# Patient Record
Sex: Male | Born: 1990 | Race: White | Hispanic: No | Marital: Single | Smoking: Never smoker
Health system: Southern US, Community
[De-identification: ages and names within clinical notes are randomized; demographics above are authoritative.]

## PROBLEM LIST (undated history)

## (undated) DIAGNOSIS — G43909 Migraine, unspecified, not intractable, without status migrainosus: Secondary | ICD-10-CM

## (undated) DIAGNOSIS — T7840XA Allergy, unspecified, initial encounter: Secondary | ICD-10-CM

## (undated) DIAGNOSIS — J301 Allergic rhinitis due to pollen: Secondary | ICD-10-CM

## (undated) DIAGNOSIS — F988 Other specified behavioral and emotional disorders with onset usually occurring in childhood and adolescence: Secondary | ICD-10-CM

## (undated) DIAGNOSIS — B2790 Infectious mononucleosis, unspecified without complication: Secondary | ICD-10-CM

## (undated) DIAGNOSIS — R569 Unspecified convulsions: Secondary | ICD-10-CM

## (undated) DIAGNOSIS — R0781 Pleurodynia: Secondary | ICD-10-CM

## (undated) HISTORY — DX: Infectious mononucleosis, unspecified without complication: B27.90

## (undated) HISTORY — DX: Allergic rhinitis due to pollen: J30.1

## (undated) HISTORY — DX: Allergy, unspecified, initial encounter: T78.40XA

## (undated) HISTORY — DX: Unspecified convulsions: R56.9

## (undated) HISTORY — DX: Migraine, unspecified, not intractable, without status migrainosus: G43.909

---
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
## 2015-01-19 ENCOUNTER — Ambulatory Visit: Payer: Self-pay | Admitting: Physician Assistant

## 2015-01-20 ENCOUNTER — Telehealth: Payer: Self-pay | Admitting: *Deleted

## 2015-01-20 NOTE — Telephone Encounter (Signed)
Unable to reach patient at time of Pre-Visit Call.  Left message for patient to return call when available.    

## 2015-01-21 ENCOUNTER — Encounter: Payer: Self-pay | Admitting: Physician Assistant

## 2015-01-21 ENCOUNTER — Ambulatory Visit (INDEPENDENT_AMBULATORY_CARE_PROVIDER_SITE_OTHER): Payer: BLUE CROSS/BLUE SHIELD | Admitting: Physician Assistant

## 2015-01-21 VITALS — BP 132/75 | HR 71 | Temp 98.1°F | Ht 73.25 in | Wt 177.8 lb

## 2015-01-21 DIAGNOSIS — Z0000 Encounter for general adult medical examination without abnormal findings: Secondary | ICD-10-CM | POA: Diagnosis not present

## 2015-01-21 DIAGNOSIS — F909 Attention-deficit hyperactivity disorder, unspecified type: Secondary | ICD-10-CM | POA: Insufficient documentation

## 2015-01-21 DIAGNOSIS — F900 Attention-deficit hyperactivity disorder, predominantly inattentive type: Secondary | ICD-10-CM

## 2015-01-21 LAB — LIPID PANEL
CHOL/HDL RATIO: 4
Cholesterol: 148 mg/dL (ref 0–200)
HDL: 40.7 mg/dL (ref >39.00)
LDL CALC: 87 mg/dL (ref 0–99)
NONHDL: 107.3
Triglycerides: 101 mg/dL (ref 0.0–149.0)
VLDL: 20.2 mg/dL (ref 0.0–40.0)

## 2015-01-21 LAB — COMPREHENSIVE METABOLIC PANEL
ALT: 17 U/L (ref 0–53)
AST: 21 U/L (ref 0–37)
Albumin: 4.8 g/dL (ref 3.5–5.2)
Alkaline Phosphatase: 59 U/L (ref 39–117)
BILIRUBIN TOTAL: 0.9 mg/dL (ref 0.2–1.2)
BUN: 12 mg/dL (ref 6–23)
CALCIUM: 9.7 mg/dL (ref 8.4–10.5)
CHLORIDE: 103 meq/L (ref 96–112)
CO2: 27 meq/L (ref 19–32)
CREATININE: 1.07 mg/dL (ref 0.40–1.50)
GFR: 91.6 mL/min (ref >60.00)
GLUCOSE: 83 mg/dL (ref 70–99)
Potassium: 4 mEq/L (ref 3.5–5.1)
Sodium: 136 mEq/L (ref 135–145)
TOTAL PROTEIN: 7.4 g/dL (ref 6.0–8.3)

## 2015-01-21 LAB — POCT URINALYSIS DIPSTICK
BILIRUBIN UA: NEGATIVE
Glucose, UA: NEGATIVE
KETONES UA: NEGATIVE
Leukocytes, UA: NEGATIVE
Nitrite, UA: NEGATIVE
PH UA: 6.5
PROTEIN UA: NEGATIVE
RBC UA: NEGATIVE
SPEC GRAV UA: 1.02
Urobilinogen, UA: 0.2

## 2015-01-21 LAB — CBC
HEMATOCRIT: 45.8 % (ref 39.0–52.0)
HEMOGLOBIN: 15.9 g/dL (ref 13.0–17.0)
MCHC: 34.8 g/dL (ref 30.0–36.0)
MCV: 91.6 fl (ref 78.0–100.0)
PLATELETS: 224 10*3/uL (ref 150.0–400.0)
RBC: 4.99 Mil/uL (ref 4.22–5.81)
RDW: 13.1 % (ref 11.5–15.5)
WBC: 5 10*3/uL (ref 4.0–10.5)

## 2015-01-21 LAB — TSH: TSH: 2.15 u[IU]/mL (ref 0.35–4.50)

## 2015-01-21 NOTE — Assessment & Plan Note (Signed)
Depression screen negative.  Tetanus up-to-date.  Preventive care discussed with patient.  Handout on preventive care given to patient in AVS.  Will obtain fasting labs today to include CBC, CMP, TSH, Lipid Panel, UA.

## 2015-01-21 NOTE — Assessment & Plan Note (Signed)
Well-controlled.  Followed by Psychiatry.

## 2015-01-21 NOTE — Progress Notes (Signed)
Patient presents to clinic today to establish care.  Is requesting CPE today.  Is fasting for labs.  Acute Concerns: No acute concerns today.  Chronic Issues: ADD -- Followed by Psychiatry.  Tolerating Vyvanse well without side effect.  Health Maintenance: Dental -- up-to-date Vision -- up-to-date Immunizations -- Tetanus up-to-date  Past Medical History  Diagnosis Date  . Hay fever   . Allergy   . Migraines   . Seizures     once after concussion  . Mononucleosis     Past Surgical History  Procedure Laterality Date  . Tonsillectomy and adenoidectomy  2003    No current outpatient prescriptions on file prior to visit.   No current facility-administered medications on file prior to visit.    No Known Allergies  Family History  Problem Relation Age of Onset  . Arthritis Mother   . Hyperlipidemia Mother   . Mental illness Mother   . Diabetes Father   . Mental illness Maternal Grandmother   . Hyperlipidemia Maternal Grandfather   . Heart disease Maternal Grandfather   . Hypertension Maternal Grandfather   . Diabetes Maternal Grandfather     History   Social History  . Marital Status: Single    Spouse Name: N/A  . Number of Children: N/A  . 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 Smoking status: Never Smoker   . Smokeless tobacco: Never Used  . Alcohol Use: 0.0 oz/week    0 Standard drinks or equivalent per week     Comment: soical  . Drug Use: No  . Sexual Activity:    Partners: Female   Other Topics Concern  . Not on file   Social History Narrative  . No narrative on file   Review of Systems  Constitutional: Negative for fever and weight loss.  HENT: Negative for ear discharge, ear pain, hearing loss and tinnitus.   Eyes: Negative for blurred vision, double vision, photophobia and pain.  Respiratory: Negative for cough and shortness of breath.   Cardiovascular:  Negative for chest pain and palpitations.  Gastrointestinal: Negative for heartburn, nausea, vomiting, abdominal pain, diarrhea, constipation, blood in stool and melena.  Genitourinary: Negative for dysuria, urgency, frequency, hematuria and flank pain.  Musculoskeletal: Negative for falls.  Neurological: Negative for dizziness, loss of consciousness and headaches.  Endo/Heme/Allergies: Negative for environmental allergies.  Psychiatric/Behavioral: Negative for depression, suicidal ideas, hallucinations and substance abuse. The patient is not nervous/anxious and does not have insomnia.     BP 132/75 mmHg  Pulse 71  Temp(Src) 98.1 F (36.7 C) (Oral)  Ht 6' 1.25" (1.861 m)  Wt 177 lb 12.8 oz (80.65 kg)  BMI 23.29 kg/m2  SpO2 100%  Physical Exam  Constitutional: He is oriented to person, place, and time and well-developed, well-nourished, and in no distress.  HENT:  Head: Normocephalic and atraumatic.  Right Ear: External ear normal.  Left Ear: External ear normal.  Nose: Nose normal.  Mouth/Throat: Oropharynx is clear and moist. No oropharyngeal exudate.  Eyes: Conjunctivae and EOM are normal. Pupils are equal, round, and reactive to light.  Neck: Neck supple. No thyromegaly present.  Cardiovascular: Normal rate, regular rhythm, normal heart sounds and intact distal pulses.   Pulmonary/Chest: Effort normal and breath sounds normal. No respiratory distress. He has no wheezes. He has no rales. He exhibits no tenderness.  Abdominal: Soft. Bowel sounds are normal. He exhibits no distension and  no mass. There is no tenderness. There is no rebound and no guarding.  Genitourinary: Testes/scrotum normal and penis normal. No discharge found.  Lymphadenopathy:    He has no cervical adenopathy.  Neurological: He is alert and oriented to person, place, and time.  Skin: Skin is warm and dry. No rash noted.  Psychiatric: Affect normal.  Vitals reviewed.  Assessment/Plan: ADHD (attention  deficit hyperactivity disorder) Well-controlled.  Followed by Psychiatry.   Visit for preventive health examination Depression screen negative.  Tetanus up-to-date.  Preventive care discussed with patient.  Handout on preventive care given to patient in AVS.  Will obtain fasting labs today to include CBC, CMP, TSH, Lipid Panel, UA.

## 2015-01-21 NOTE — Progress Notes (Signed)
Pre visit review using our clinic review tool, if applicable. No additional management support is needed unless otherwise documented below in the visit note. 

## 2015-01-21 NOTE — Patient Instructions (Signed)
Please follow-up with specialist as scheduled. Stop by the lab for blood work. I will call you with your results. If all looks good we will follow-up yearly for your annual exam and then whenever you need me for sick visits. If anything is abnormal we will treat you accordingly and get you in for a follow-up visit.  Preventive Care for Adults A healthy lifestyle and preventive care can promote health and wellness. Preventive health guidelines for men include the following key practices:  A routine yearly physical is a good way to check with your health care provider about your health and preventative screening. It is a chance to share any concerns and updates on your health and to receive a thorough exam.  Visit your dentist for a routine exam and preventative care every 6 months. Brush your teeth twice a day and floss once a day. Good oral hygiene prevents tooth decay and gum disease.  The frequency of eye exams is based on your age, health, family medical history, use of contact lenses, and other factors. Follow your health care provider's recommendations for frequency of eye exams.  Eat a healthy diet. Foods such as vegetables, fruits, whole grains, low-fat dairy products, and lean protein foods contain the nutrients you need without too many calories. Decrease your intake of foods high in solid fats, added sugars, and salt. Eat the right amount of calories for you.Get information about a proper diet from your health care provider, if necessary.  Regular physical exercise is one of the most important things you can do for your health. Most adults should get at least 150 minutes of moderate-intensity exercise (any activity that increases your heart rate and causes you to sweat) each week. In addition, most adults need muscle-strengthening exercises on 2 or more days a week.  Maintain a healthy weight. The body mass index (BMI) is a screening tool to identify possible weight problems. It provides  an estimate of body fat based on height and weight. Your health care provider can find your BMI and can help you achieve or maintain a healthy weight.For adults 20 years and older:  A BMI below 18.5 is considered underweight.  A BMI of 18.5 to 24.9 is normal.  A BMI of 25 to 29.9 is considered overweight.  A BMI of 30 and above is considered obese.  Maintain normal blood lipids and cholesterol levels by exercising and minimizing your intake of saturated fat. Eat a balanced diet with plenty of fruit and vegetables. Blood tests for lipids and cholesterol should begin at age 85 and be repeated every 5 years. If your lipid or cholesterol levels are high, you are over 50, or you are at high risk for heart disease, you may need your cholesterol levels checked more frequently.Ongoing high lipid and cholesterol levels should be treated with medicines if diet and exercise are not working.  If you smoke, find out from your health care provider how to quit. If you do not use tobacco, do not start.  Lung cancer screening is recommended for adults aged 82-80 years who are at high risk for developing lung cancer because of a history of smoking. A yearly low-dose CT scan of the lungs is recommended for people who have at least a 30-pack-year history of smoking and are a current smoker or have quit within the past 15 years. A pack year of smoking is smoking an average of 1 pack of cigarettes a day for 1 year (for example: 1 pack a day  for 30 years or 2 packs a day for 15 years). Yearly screening should continue until the smoker has stopped smoking for at least 15 years. Yearly screening should be stopped for people who develop a health problem that would prevent them from having lung cancer treatment.  If you choose to drink alcohol, do not have more than 2 drinks per day. One drink is considered to be 12 ounces (355 mL) of beer, 5 ounces (148 mL) of wine, or 1.5 ounces (44 mL) of liquor.  Avoid use of street  drugs. Do not share needles with anyone. Ask for help if you need support or instructions about stopping the use of drugs.  High blood pressure causes heart disease and increases the risk of stroke. Your blood pressure should be checked at least every 1-2 years. Ongoing high blood pressure should be treated with medicines, if weight loss and exercise are not effective.  If you are 16-37 years old, ask your health care provider if you should take aspirin to prevent heart disease.  Diabetes screening involves taking a blood sample to check your fasting blood sugar level. This should be done once every 3 years, after age 27, if you are within normal weight and without risk factors for diabetes. Testing should be considered at a younger age or be carried out more frequently if you are overweight and have at least 1 risk factor for diabetes.  Colorectal cancer can be detected and often prevented. Most routine colorectal cancer screening begins at the age of 72 and continues through age 37. However, your health care provider may recommend screening at an earlier age if you have risk factors for colon cancer. On a yearly basis, your health care provider may provide home test kits to check for hidden blood in the stool. Use of a small camera at the end of a tube to directly examine the colon (sigmoidoscopy or colonoscopy) can detect the earliest forms of colorectal cancer. Talk to your health care provider about this at age 24, when routine screening begins. Direct exam of the colon should be repeated every 5-10 years through age 6, unless early forms of precancerous polyps or small growths are found.  People who are at an increased risk for hepatitis B should be screened for this virus. You are considered at high risk for hepatitis B if:  You were born in a country where hepatitis B occurs often. Talk with your health care provider about which countries are considered high risk.  Your parents were born in a  high-risk country and you have not received a shot to protect against hepatitis B (hepatitis B vaccine).  You have HIV or AIDS.  You use needles to inject street drugs.  You live with, or have sex with, someone who has hepatitis B.  You are a man who has sex with other men (MSM).  You get hemodialysis treatment.  You take certain medicines for conditions such as cancer, organ transplantation, and autoimmune conditions.  Hepatitis C blood testing is recommended for all people born from [birth year] through 1965 and any individual with known risks for hepatitis C.  Practice safe sex. Use condoms and avoid high-risk sexual practices to reduce the spread of sexually transmitted infections (STIs). STIs include gonorrhea, chlamydia, syphilis, trichomonas, herpes, HPV, and human immunodeficiency virus (HIV). Herpes, HIV, and HPV are viral illnesses that have no cure. They can result in disability, cancer, and death.  If you are at risk of being infected with HIV, it  is recommended that you take a prescription medicine daily to prevent HIV infection. This is called preexposure prophylaxis (PrEP). You are considered at risk if:  You are a man who has sex with other men (MSM) and have other risk factors.  You are a heterosexual man, are sexually active, and are at increased risk for HIV infection.  You take drugs by injection.  You are sexually active with a partner who has HIV.  Talk with your health care provider about whether you are at high risk of being infected with HIV. If you choose to begin PrEP, you should first be tested for HIV. You should then be tested every 3 months for as long as you are taking PrEP.  A one-time screening for abdominal aortic aneurysm (AAA) and surgical repair of large AAAs by ultrasound are recommended for men ages 80 to 72 years who are current or former smokers.  Healthy men should no longer receive prostate-specific antigen (PSA) blood tests as part of routine  cancer screening. Talk with your health care provider about prostate cancer screening.  Testicular cancer screening is not recommended for adult males who have no symptoms. Screening includes self-exam, a health care provider exam, and other screening tests. Consult with your health care provider about any symptoms you have or any concerns you have about testicular cancer.  Use sunscreen. Apply sunscreen liberally and repeatedly throughout the day. You should seek shade when your shadow is shorter than you. Protect yourself by wearing long sleeves, pants, a wide-brimmed hat, and sunglasses year round, whenever you are outdoors.  Once a month, do a whole-body skin exam, using a mirror to look at the skin on your back. Tell your health care provider about new moles, moles that have irregular borders, moles that are larger than a pencil eraser, or moles that have changed in shape or color.  Stay current with required vaccines (immunizations).  Influenza vaccine. All adults should be immunized every year.  Tetanus, diphtheria, and acellular pertussis (Td, Tdap) vaccine. An adult who has not previously received Tdap or who does not know his vaccine status should receive 1 dose of Tdap. This initial dose should be followed by tetanus and diphtheria toxoids (Td) booster doses every 10 years. Adults with an unknown or incomplete history of completing a 3-dose immunization series with Td-containing vaccines should begin or complete a primary immunization series including a Tdap dose. Adults should receive a Td booster every 10 years.  Varicella vaccine. An adult without evidence of immunity to varicella should receive 2 doses or a second dose if he has previously received 1 dose.  Human papillomavirus (HPV) vaccine. Males aged 77-21 years who have not received the vaccine previously should receive the 3-dose series. Males aged 22-26 years may be immunized. Immunization is recommended through the age of 44  years for any male who has sex with males and did not get any or all doses earlier. Immunization is recommended for any person with an immunocompromised condition through the age of 37 years if he did not get any or all doses earlier. During the 3-dose series, the second dose should be obtained 4-8 weeks after the first dose. The third dose should be obtained 24 weeks after the first dose and 16 weeks after the second dose.  Zoster vaccine. One dose is recommended for adults aged 20 years or older unless certain conditions are present.  Measles, mumps, and rubella (MMR) vaccine. Adults born before [birth year] generally are considered immune to measles  and mumps. Adults born in [birth year] or later should have 1 or more doses of MMR vaccine unless there is a contraindication to the vaccine or there is laboratory evidence of immunity to each of the three diseases. A routine second dose of MMR vaccine should be obtained at least 28 days after the first dose for students attending postsecondary schools, health care workers, or international travelers. People who received inactivated measles vaccine or an unknown type of measles vaccine during 1963-1967 should receive 2 doses of MMR vaccine. People who received inactivated mumps vaccine or an unknown type of mumps vaccine before 1979 and are at high risk for mumps infection should consider immunization with 2 doses of MMR vaccine. Unvaccinated health care workers born before [birth year] who lack laboratory evidence of measles, mumps, or rubella immunity or laboratory confirmation of disease should consider measles and mumps immunization with 2 doses of MMR vaccine or rubella immunization with 1 dose of MMR vaccine.  Pneumococcal 13-valent conjugate (PCV13) vaccine. When indicated, a person who is uncertain of his immunization history and has no record of immunization should receive the PCV13 vaccine. An adult aged 41 years or older who has certain medical conditions and has not been  previously immunized should receive 1 dose of PCV13 vaccine. This PCV13 should be followed with a dose of pneumococcal polysaccharide (PPSV23) vaccine. The PPSV23 vaccine dose should be obtained at least 8 weeks after the dose of PCV13 vaccine. An adult aged 39 years or older who has certain medical conditions and previously received 1 or more doses of PPSV23 vaccine should receive 1 dose of PCV13. The PCV13 vaccine dose should be obtained 1 or more years after the last PPSV23 vaccine dose.  Pneumococcal polysaccharide (PPSV23) vaccine. When PCV13 is also indicated, PCV13 should be obtained first. All adults aged 84 years and older should be immunized. An adult younger than age 66 years who has certain medical conditions should be immunized. Any person who resides in a nursing home or long-term care facility should be immunized. An adult smoker should be immunized. People with an immunocompromised condition and certain other conditions should receive both PCV13 and PPSV23 vaccines. People with human immunodeficiency virus (HIV) infection should be immunized as soon as possible after diagnosis. Immunization during chemotherapy or radiation therapy should be avoided. Routine use of PPSV23 vaccine is not recommended for American Indians, [place] Natives, or people younger than 65 years unless there are medical conditions that require PPSV23 vaccine. When indicated, people who have unknown immunization and have no record of immunization should receive PPSV23 vaccine. One-time revaccination 5 years after the first dose of PPSV23 is recommended for people aged 19-64 years who have chronic kidney failure, nephrotic syndrome, asplenia, or immunocompromised conditions. People who received 1-2 doses of PPSV23 before age 43 years should receive another dose of PPSV23 vaccine at age 44 years or later if at least 5 years have passed since the previous dose. Doses of PPSV23 are not needed for people immunized with PPSV23 at or  after age 78 years.  Meningococcal vaccine. Adults with asplenia or persistent complement component deficiencies should receive 2 doses of quadrivalent meningococcal conjugate (MenACWY-D) vaccine. The doses should be obtained at least 2 months apart. Microbiologists working with certain meningococcal bacteria, [employer] recruits, people at risk during an outbreak, and people who travel to or live in countries with a high rate of meningitis should be immunized. A first-year college student up through age 35 years who is living in a residence hall  should receive a dose if he did not receive a dose on or after his 16th birthday. Adults who have certain high-risk conditions should receive one or more doses of vaccine.  Hepatitis A vaccine. Adults who wish to be protected from this disease, have certain high-risk conditions, work with hepatitis A-infected animals, work in hepatitis A research labs, or travel to or work in countries with a high rate of hepatitis A should be immunized. Adults who were previously unvaccinated and who anticipate close contact with an international adoptee during the first 60 days after arrival in the [place] from a country with a high rate of hepatitis A should be immunized.  Hepatitis B vaccine. Adults should be immunized if they wish to be protected from this disease, have certain high-risk conditions, may be exposed to blood or other infectious body fluids, are household contacts or sex partners of hepatitis B positive people, are clients or workers in certain care facilities, or travel to or work in countries with a high rate of hepatitis B.  Haemophilus influenzae type b (Hib) vaccine. A previously unvaccinated person with asplenia or sickle cell disease or having a scheduled splenectomy should receive 1 dose of Hib vaccine. Regardless of previous immunization, a recipient of a hematopoietic stem cell transplant should receive a 3-dose series 6-12 months after his  successful transplant. Hib vaccine is not recommended for adults with HIV infection. Preventive Service / Frequency Ages 52 to 17  Blood pressure check.** / Every 1 to 2 years.  Lipid and cholesterol check.** / Every 5 years beginning at age 57.  Hepatitis C blood test.** / For any individual with known risks for hepatitis C.  Skin self-exam. / Monthly.  Influenza vaccine. / Every year.  Tetanus, diphtheria, and acellular pertussis (Tdap, Td) vaccine.** / Consult your health care provider. 1 dose of Td every 10 years.  Varicella vaccine.** / Consult your health care provider.  HPV vaccine. / 3 doses over 6 months, if 45 or younger.  Measles, mumps, rubella (MMR) vaccine.** / You need at least 1 dose of MMR if you were born in 1957 or later. You may also need a second dose.  Pneumococcal 13-valent conjugate (PCV13) vaccine.** / Consult your health care provider.  Pneumococcal polysaccharide (PPSV23) vaccine.** / 1 to 2 doses if you smoke cigarettes or if you have certain conditions.  Meningococcal vaccine.** / 1 dose if you are age 11 to 66 years and a Market researcher living in a residence hall, or have one of several medical conditions. You may also need additional booster doses.  Hepatitis A vaccine.** / Consult your health care provider.  Hepatitis B vaccine.** / Consult your health care provider.  Haemophilus influenzae type b (Hib) vaccine.** / Consult your health care provider. Ages 34 to 15  Blood pressure check.** / Every 1 to 2 years.  Lipid and cholesterol check.** / Every 5 years beginning at age 65.  Lung cancer screening. / Every year if you are aged 44-80 years and have a 30-pack-year history of smoking and currently smoke or have quit within the past 15 years. Yearly screening is stopped once you have quit smoking for at least 15 years or develop a health problem that would prevent you from having lung cancer treatment.  Fecal occult blood test (FOBT)  of stool. / Every year beginning at age 8 and continuing until age 29. You may not have to do this test if you get a colonoscopy every 10 years.  Flexible  sigmoidoscopy** or colonoscopy.** / Every 5 years for a flexible sigmoidoscopy or every 10 years for a colonoscopy beginning at age 28 and continuing until age 47.  Hepatitis C blood test.** / For all people born from [birth year] through 1963 and any individual with known risks for hepatitis C.  Skin self-exam. / Monthly.  Influenza vaccine. / Every year.  Tetanus, diphtheria, and acellular pertussis (Tdap/Td) vaccine.** / Consult your health care provider. 1 dose of Td every 10 years.  Varicella vaccine.** / Consult your health care provider.  Zoster vaccine.** / 1 dose for adults aged 37 years or older.  Measles, mumps, rubella (MMR) vaccine.** / You need at least 1 dose of MMR if you were born in 1955 or later. You may also need a second dose.  Pneumococcal 13-valent conjugate (PCV13) vaccine.** / Consult your health care provider.  Pneumococcal polysaccharide (PPSV23) vaccine.** / 1 to 2 doses if you smoke cigarettes or if you have certain conditions.  Meningococcal vaccine.** / Consult your health care provider.  Hepatitis A vaccine.** / Consult your health care provider.  Hepatitis B vaccine.** / Consult your health care provider.  Haemophilus influenzae type b (Hib) vaccine.** / Consult your health care provider. Ages 80 and over  Blood pressure check.** / Every 1 to 2 years.  Lipid and cholesterol check.**/ Every 5 years beginning at age 35.  Lung cancer screening. / Every year if you are aged 79-80 years and have a 30-pack-year history of smoking and currently smoke or have quit within the past 15 years. Yearly screening is stopped once you have quit smoking for at least 15 years or develop a health problem that would prevent you from having lung cancer treatment.  Fecal occult blood test (FOBT) of stool. / Every year  beginning at age 14 and continuing until age 53. You may not have to do this test if you get a colonoscopy every 10 years.  Flexible sigmoidoscopy** or colonoscopy.** / Every 5 years for a flexible sigmoidoscopy or every 10 years for a colonoscopy beginning at age 48 and continuing until age 21.  Hepatitis C blood test.** / For all people born from [birth year] through 1963 and any individual with known risks for hepatitis C.  Abdominal aortic aneurysm (AAA) screening.** / A one-time screening for ages 22 to 85 years who are current or former smokers.  Skin self-exam. / Monthly.  Influenza vaccine. / Every year.  Tetanus, diphtheria, and acellular pertussis (Tdap/Td) vaccine.** / 1 dose of Td every 10 years.  Varicella vaccine.** / Consult your health care provider.  Zoster vaccine.** / 1 dose for adults aged 59 years or older.  Pneumococcal 13-valent conjugate (PCV13) vaccine.** / Consult your health care provider.  Pneumococcal polysaccharide (PPSV23) vaccine.** / 1 dose for all adults aged 34 years and older.  Meningococcal vaccine.** / Consult your health care provider.  Hepatitis A vaccine.** / Consult your health care provider.  Hepatitis B vaccine.** / Consult your health care provider.  Haemophilus influenzae type b (Hib) vaccine.** / Consult your health care provider. **Family history and personal history of risk and conditions may change your health care provider's recommendations. Document Released: 10/18/2001 Document Revised: 08/27/2013 Document Reviewed: 01/17/2011 [facility] Patient Information 2015 [place], [place]. This information is not intended to replace advice given to you by your health care provider. Make sure you discuss any questions you have with your health care provider.

## 2015-03-31 ENCOUNTER — Emergency Department (HOSPITAL_BASED_OUTPATIENT_CLINIC_OR_DEPARTMENT_OTHER)
Admission: EM | Admit: 2015-03-31 | Discharge: 2015-03-31 | Disposition: A | Discharge status: Home / Self Care | Payer: BLUE CROSS/BLUE SHIELD | Attending: Emergency Medicine | Admitting: Emergency Medicine

## 2015-03-31 ENCOUNTER — Emergency Department (HOSPITAL_BASED_OUTPATIENT_CLINIC_OR_DEPARTMENT_OTHER): Payer: BLUE CROSS/BLUE SHIELD

## 2015-03-31 ENCOUNTER — Other Ambulatory Visit: Payer: Self-pay

## 2015-03-31 ENCOUNTER — Encounter (HOSPITAL_BASED_OUTPATIENT_CLINIC_OR_DEPARTMENT_OTHER): Payer: Self-pay | Admitting: *Deleted

## 2015-03-31 DIAGNOSIS — Z8669 Personal history of other diseases of the nervous system and sense organs: Secondary | ICD-10-CM | POA: Insufficient documentation

## 2015-03-31 DIAGNOSIS — J302 Other seasonal allergic rhinitis: Secondary | ICD-10-CM | POA: Insufficient documentation

## 2015-03-31 DIAGNOSIS — Z8679 Personal history of other diseases of the circulatory system: Secondary | ICD-10-CM | POA: Insufficient documentation

## 2015-03-31 DIAGNOSIS — J029 Acute pharyngitis, unspecified: Secondary | ICD-10-CM | POA: Insufficient documentation

## 2015-03-31 DIAGNOSIS — R0602 Shortness of breath: Secondary | ICD-10-CM | POA: Diagnosis present

## 2015-03-31 DIAGNOSIS — Z8619 Personal history of other infectious and parasitic diseases: Secondary | ICD-10-CM | POA: Diagnosis not present

## 2015-03-31 DIAGNOSIS — M940 Chondrocostal junction syndrome [Tietze]: Secondary | ICD-10-CM | POA: Diagnosis not present

## 2015-03-31 MED ORDER — GI COCKTAIL ~~LOC~~
30.0000 mL | Freq: Once | ORAL | Status: AC
  Administered 2015-03-31: 30 mL via ORAL
  Filled 2015-03-31: qty 30

## 2015-03-31 MED ORDER — ALBUTEROL SULFATE HFA 108 (90 BASE) MCG/ACT IN AERS
2.0000 | INHALATION_SPRAY | Freq: Once | RESPIRATORY_TRACT | Status: AC
  Administered 2015-03-31: 2 via RESPIRATORY_TRACT
  Filled 2015-03-31: qty 87.1

## 2015-03-31 NOTE — ED Notes (Signed)
Pt transported to XRay 

## 2015-03-31 NOTE — ED Notes (Signed)
Resident at bedside discussing plan and results with patient and family.

## 2015-03-31 NOTE — Discharge Instructions (Signed)
Chest Wall Pain Chest wall pain is pain in or around the bones and muscles of your chest. It may take up to 6 weeks to get better. It may take longer if you must stay physically active in your work and activities.  CAUSES  Chest wall pain may happen on its own. However, it may be caused by:  A viral illness like the flu.  Injury.  Coughing.  Exercise.  Arthritis.  Fibromyalgia.  Shingles. HOME CARE INSTRUCTIONS   Avoid overtiring physical activity. Try not to strain or perform activities that cause pain. This includes any activities using your chest or your abdominal and side muscles, especially if heavy weights are used.  Put ice on the sore area.  Put ice in a plastic bag.  Place a towel between your skin and the bag.  Leave the ice on for 15-20 minutes per hour while awake for the first 2 days.  Only take over-the-counter or prescription medicines for pain, discomfort, or fever as directed by your caregiver. SEEK IMMEDIATE MEDICAL CARE IF:   Your pain increases, or you are very uncomfortable.  You have a fever.  Your chest pain becomes worse.  You have new, unexplained symptoms.  You have nausea or vomiting.  You feel sweaty or lightheaded.  You have a cough with phlegm (sputum), or you cough up blood. MAKE SURE YOU:   Understand these instructions.  Will watch your condition.  Will get help right away if you are not doing well or get worse. Document Released: 08/22/2005 Document Revised: 11/14/2011 Document Reviewed: 04/18/2011 [facility] Patient Information 2015 [place], [place]. This information is not intended to replace advice given to you by your health care provider. Make sure you discuss any questions you have with your health care provider.    Costochondritis Costochondritis, sometimes called Tietze syndrome, is a swelling and irritation (inflammation) of the tissue (cartilage) that connects your ribs with your breastbone (sternum). It causes pain  in the chest and rib area. Costochondritis usually goes away on its own over time. It can take up to 6 weeks or longer to get better, especially if you are unable to limit your activities. CAUSES  Some cases of costochondritis have no known cause. Possible causes include:  Injury (trauma).  Exercise or activity such as lifting.  Severe coughing. SIGNS AND SYMPTOMS  Pain and tenderness in the chest and rib area.  Pain that gets worse when coughing or taking deep breaths.  Pain that gets worse with specific movements. DIAGNOSIS  Your health care provider will do a physical exam and ask about your symptoms. Chest X-rays or other tests may be done to rule out other problems. TREATMENT  Costochondritis usually goes away on its own over time. Your health care provider may prescribe medicine to help relieve pain. HOME CARE INSTRUCTIONS   Avoid exhausting physical activity. Try not to strain your ribs during normal activity. This would include any activities using chest, abdominal, and side muscles, especially if heavy weights are used.  Apply ice to the affected area for the first 2 days after the pain begins.  Put ice in a plastic bag.  Place a towel between your skin and the bag.  Leave the ice on for 20 minutes, 2-3 times a day.  Only take over-the-counter or prescription medicines as directed by your health care provider. SEEK MEDICAL CARE IF:  You have redness or swelling at the rib joints. These are signs of infection.  Your pain does not go away  despite rest or medicine. SEEK IMMEDIATE MEDICAL CARE IF:   Your pain increases or you are very uncomfortable.  You have shortness of breath or difficulty breathing.  You cough up blood.  You have worse chest pains, sweating, or vomiting.  You have a fever or persistent symptoms for more than 2-3 days.  You have a fever and your symptoms suddenly get worse. MAKE SURE YOU:   Understand these instructions.  Will watch your  condition.  Will get help right away if you are not doing well or get worse. Document Released: 06/01/2005 Document Revised: 06/12/2013 Document Reviewed: 03/26/2013 [facility] Patient Information 2015 [place], [place]. This information is not intended to replace advice given to you by your health care provider. Make sure you discuss any questions you have with your health care provider.    Take motrin  Q6 hrs. For a week.   Also, take Nexium for one month.   This should relieve your symptoms.   Follow up with your PCP as needed for further evaluation.   Devota Pace, MD Family Medicine - PGY 2

## 2015-03-31 NOTE — ED Notes (Signed)
MD, resident at bedside. 

## 2015-03-31 NOTE — ED Notes (Signed)
MD Plunkett at bedside. 

## 2015-03-31 NOTE — ED Provider Notes (Signed)
CSN: [id number]     Arrival date & time 03/31/15  7829 History   First MD Initiated Contact with Patient 03/31/15 [phone]     Chief Complaint  Patient presents with  . Shortness of Breath     (Consider location/radiation/quality/duration/timing/severity/associated sxs/prior Treatment) HPI Comments: Pt. Is a 24 y/o CM here with complaints of shortness of breath ongoing for the past month and intermittently (once per month) over the past year or so. He has now had some ongoing intermittent sharp chest pain that has occurred over the past several days that became worse overnight. He says he experienced the pain around 11pm last night, took an ibuprofen, and went to bed. He was awakened by the pain / SOB around 4 am, and then again at 5 am. He says that he was concerned about the pain so he came to the ED. The pain is worse with deep inspiration. It is somewhat worse with lying flat. He says that it does not radiate, he does not have diaphoresis, pain with exertion, arm pain, neck pain, or jaw pain. He has not had nausea, or vomiting. He has not had any sick contacts, nor has he been sick. He has not had any back pain. The pain is not reproducible to palpation. 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 He stays in his parents' basement, and mom is concerned that there may be mold in the basement.    The history is provided by the patient.    Past Medical History  Diagnosis Date  . Hay fever   . Allergy   . Migraines   . Seizures     once after concussion  . Mononucleosis    Past Surgical History  Procedure Laterality Date  . Tonsillectomy and adenoidectomy  2003   Family History  Problem Relation Age of Onset  . Arthritis Mother   . Hyperlipidemia Mother   . Mental illness Mother   . Diabetes Father   . Mental illness Maternal Grandmother   . Hyperlipidemia Maternal Grandfather   . Heart disease Maternal Grandfather   .  Hypertension Maternal Grandfather   . Diabetes Maternal Grandfather    History  Substance Use Topics  . Smoking status: Never Smoker   . Smokeless tobacco: Never Used  . Alcohol Use: 0.0 oz/week    0 Standard drinks or equivalent per week     Comment: soical    Review of Systems  Constitutional: Negative for fever, chills, diaphoresis, activity change, appetite change, fatigue and unexpected weight change.  HENT: Positive for sore throat. Negative for congestion, ear pain, postnasal drip, rhinorrhea, sinus pressure and trouble swallowing.   Eyes: Negative for photophobia, pain and redness.  Respiratory: Positive for shortness of breath. Negative for cough, choking, chest tightness, wheezing and stridor.   Cardiovascular: Positive for chest pain. Negative for palpitations and leg swelling.  Gastrointestinal: Negative for nausea, vomiting, abdominal pain, diarrhea, constipation and abdominal distention.  Endocrine: Negative for polydipsia, polyphagia and polyuria.  Genitourinary: Negative for dysuria, urgency, frequency, hematuria and flank pain.  Musculoskeletal: Negative for myalgias, back pain, joint swelling, arthralgias, neck pain and neck stiffness.  Skin: Negative for color change and rash.  Allergic/Immunologic: Positive for environmental allergies. Negative for food allergies.  Neurological: Negative.  Negative for dizziness, syncope, weakness, light-headedness, numbness and headaches.  Hematological: Negative.  Negative for adenopathy.  Psychiatric/Behavioral: Negative.       Allergies  Review of  patient's allergies indicates no known allergies.  Home Medications   Prior to Admission medications   Medication Sig Start Date End Date Taking? Authorizing Provider  VYVANSE 30 MG capsule 40 mg daily as needed.  11/14/14   Historical Provider, MD   BP 133/85 mmHg  Pulse 76  Temp(Src) 97.9 F (36.6 C) (Oral)  Resp 16  SpO2 98% Physical Exam  Constitutional: He is  oriented to person, place, and time. He appears well-developed and well-nourished. No distress.  HENT:  Head: Normocephalic and atraumatic.  Mouth/Throat: No oropharyngeal exudate.  Eyes: Conjunctivae and EOM are normal. Pupils are equal, round, and reactive to light. Right eye exhibits no discharge. Left eye exhibits no discharge.  Neck: Normal range of motion. Neck supple. No JVD present. No thyromegaly present.  Cardiovascular: Normal rate, regular rhythm, normal heart sounds and intact distal pulses.  Exam reveals no gallop and no friction rub.   No murmur heard. Pulmonary/Chest: Effort normal and breath sounds normal. No accessory muscle usage. No respiratory distress. He has no wheezes. He has no rhonchi. He has no rales. He exhibits no mass, no tenderness, no bony tenderness, no crepitus and no swelling.  Abdominal: Soft. Bowel sounds are normal. He exhibits no distension and no mass. There is no tenderness. There is no rebound and no guarding.  Musculoskeletal: Normal range of motion. He exhibits no edema or tenderness.  Lymphadenopathy:    He has no cervical adenopathy.  Neurological: He is alert and oriented to person, place, and time.  Skin: Skin is warm and dry. No rash noted. He is not diaphoretic. No erythema.  Psychiatric: He has a normal mood and affect. His behavior is normal.    ED Course  Procedures (including critical care time) Labs Review Labs Reviewed - No data to display  Imaging Review Dg Chest 2 View  03/31/2015   CLINICAL DATA:  Chest pain. Intermittent shortness of breath for several months. Symptoms for 6 months. Initial encounter. Persist in chest pain with onset of symptoms last night.  EXAM: CHEST  2 VIEW  COMPARISON:  01/17/2010.  FINDINGS: Cardiopericardial silhouette within normal limits. Mediastinal contours normal. Trachea midline. No airspace disease or effusion.  IMPRESSION: No active cardiopulmonary disease.   Electronically Signed   By: Andreas Newport M.D.   On: 03/31/2015 07:51     EKG Interpretation   Date/Time:  Tuesday March 31 2015 07:16:30 EDT Ventricular Rate:  72 PR Interval:  172 QRS Duration: 98 QT Interval:  364 QTC Calculation: 398 R Axis:   84 Text Interpretation:  Normal sinus rhythm Normal ECG No previous tracing  Confirmed by Anitra Lauth  MD, WHITNEY ([id number]) on 03/31/2015 7:17:05 AM      MDM   Final diagnoses:  Costochondritis    Pt. Is a 24 y/o M here with shortness of breath and some mild intermittent chest pain. Exam and history consistent with costochondritis vs. Reflux as etiology. May have some allergic component as well given hx of albuterol requirement. No wheezes to exam. Will get CXR, EKG is WNL. GI cocktail and albuterol for symptoms.   Pt. Improved with GI cocktail and albuterol. Plan to d/c home with motrin and ppi for ongoing therapy and close follow up with their PCP for further evaluation as needed. Safe for discharge to home.   Yolande Jolly, MD 03/31/15 6045  Gwyneth Sprout, MD 03/31/15 [phone]

## 2015-03-31 NOTE — ED Notes (Signed)
Patient states he has had intermittent sob for several months.  History of allergy induced asthma as a child.  States last night he developed pain in his throat and anterior chest.  States he is currently under a lot of stress for school and has been staying in his parents basement.  Describes the pain as intermittent and sharp.

## 2015-04-01 MED FILL — Albuterol Sulfate Inhal Aero 108 MCG/ACT (90MCG Base Equiv): RESPIRATORY_TRACT | Qty: 6.7 | Status: AC

## 2015-04-13 ENCOUNTER — Encounter: Payer: Self-pay | Admitting: Physician Assistant

## 2015-04-13 ENCOUNTER — Ambulatory Visit (INDEPENDENT_AMBULATORY_CARE_PROVIDER_SITE_OTHER): Payer: BLUE CROSS/BLUE SHIELD | Admitting: Physician Assistant

## 2015-04-13 VITALS — BP 100/50 | HR 113 | Temp 97.9°F | Ht 73.25 in | Wt 181.8 lb

## 2015-04-13 DIAGNOSIS — J4520 Mild intermittent asthma, uncomplicated: Secondary | ICD-10-CM | POA: Diagnosis not present

## 2015-04-13 MED ORDER — MONTELUKAST SODIUM 10 MG PO TABS: 10.0000 mg | ORAL_TABLET | Freq: Every day | ORAL | Status: DC

## 2015-04-13 NOTE — Progress Notes (Signed)
Pre visit review using our clinic review tool, if applicable. No additional management support is needed unless otherwise documented below in the visit note. 

## 2015-04-13 NOTE — Progress Notes (Signed)
Patient presents to clinic today for ER follow-up of chest pain thought to be either costochondritis or related to reflux. Workup including EKG and CXR unremarkable. Patient given GI cocktail and albuterol in ER with improvement. Patient endorses chest pain has resolved but notes occasional chest tightness when studying in his room which is located in the basement of his house. Endorses childhood asthma and allergies. Endorses needing to use albuterol when in basement. Has changed study area with improvement in symptoms. Denies palpitation, lightheadedness or dizziness. Denies nighttime symptoms.  Past Medical History  Diagnosis Date  . Hay fever   . Allergy   . Migraines   . Seizures     once after concussion  . Mononucleosis     No current outpatient prescriptions on file prior to visit.   No current facility-administered medications on file prior to visit.    No Known Allergies  Family History  Problem Relation Age of Onset  . Arthritis Mother   . Hyperlipidemia Mother   . Mental illness Mother   . Diabetes Father   . Mental illness Maternal Grandmother   . Hyperlipidemia Maternal Grandfather   . Heart disease Maternal Grandfather   . Hypertension Maternal Grandfather   . Diabetes Maternal Grandfather     History   Social History  . Marital Status: Single    Spouse Name: N/A  . Number of Children: N/A  . 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 Smoking status: Never Smoker   . Smokeless tobacco: Never Used  . Alcohol Use: 0.0 oz/week    0 Standard drinks or equivalent per week     Comment: soical  . Drug Use: No  . Sexual Activity:    Partners: Female   Other Topics Concern  . None   Social History Narrative   Review of Systems - See HPI.  All other ROS are negative.  BP 100/50 mmHg  Pulse 113  Temp(Src) 97.9 F (36.6 C) (Oral)  Ht 6' 1.25" (1.861 m)  Wt 181 lb 12.8 oz  (82.464 kg)  BMI 23.81 kg/m2  SpO2 98%  Physical Exam  Constitutional: He is oriented to person, place, and time and well-developed, well-nourished, and in no distress.  HENT:  Head: Normocephalic and atraumatic.  Eyes: Conjunctivae are normal.  Cardiovascular: Regular rhythm, normal heart sounds and intact distal pulses.   Pulmonary/Chest: Effort normal and breath sounds normal. No respiratory distress. He has no wheezes. He has no rales. He exhibits no tenderness.  Neurological: He is alert and oriented to person, place, and time.  Skin: Skin is warm and dry. No rash noted.  Psychiatric: Affect normal.  Vitals reviewed.   Recent Results (from the past 2160 hour(s))  CBC     Status: None   Collection Time: 01/21/15 10:09 AM  Result Value Ref Range   WBC 5.0 4.0 - 10.5 K/uL   RBC 4.99 4.22 - 5.81 Mil/uL   Platelets 224.0 150.0 - 400.0 K/uL   Hemoglobin 15.9 13.0 - 17.0 g/dL   HCT 45.8 39.0 - 52.0 %   MCV 91.6 78.0 - 100.0 fl   MCHC 34.8 30.0 - 36.0 g/dL   RDW 13.1 11.5 - 15.5 %  Comp Met (CMET)     Status: None   Collection Time: 01/21/15 10:09 AM  Result Value Ref Range   Sodium 136 135 - 145 mEq/L   Potassium 4.0 3.5 -  5.1 mEq/L   Chloride 103 96 - 112 mEq/L   CO2 27 19 - 32 mEq/L   Glucose, Bld 83 70 - 99 mg/dL   BUN 12 6 - 23 mg/dL   Creatinine, Ser 1.07 0.40 - 1.50 mg/dL   Total Bilirubin 0.9 0.2 - 1.2 mg/dL   Alkaline Phosphatase 59 39 - 117 U/L   AST 21 0 - 37 U/L   ALT 17 0 - 53 U/L   Total Protein 7.4 6.0 - 8.3 g/dL   Albumin 4.8 3.5 - 5.2 g/dL   Calcium 9.7 8.4 - 10.5 mg/dL   GFR 91.60 >60.00 mL/min  TSH     Status: None   Collection Time: 01/21/15 10:09 AM  Result Value Ref Range   TSH 2.15 0.35 - 4.50 uIU/mL  Lipid Profile     Status: None   Collection Time: 01/21/15 10:09 AM  Result Value Ref Range   Cholesterol 148 0 - 200 mg/dL    Comment: ATP III Classification       Desirable:  < 200 mg/dL               Borderline High:  200 - 239 mg/dL           High:  > = 240 mg/dL   Triglycerides 101.0 0.0 - 149.0 mg/dL    Comment: Normal:  <150 mg/dLBorderline High:  150 - 199 mg/dL   HDL 40.70 >39.00 mg/dL   VLDL 20.2 0.0 - 40.0 mg/dL   LDL Cholesterol 87 0 - 99 mg/dL   Total CHOL/HDL Ratio 4     Comment:                Men          Women1/2 Average Risk     3.4          3.3Average Risk          5.0          4.42X Average Risk          9.6          7.13X Average Risk          15.0          11.0                       NonHDL 107.30     Comment: NOTE:  Non-HDL goal should be 30 mg/dL higher than patient's LDL goal (i.e. LDL goal of < 70 mg/dL, would have non-HDL goal of < 100 mg/dL)  POCT Urinalysis Dipstick     Status: None   Collection Time: 01/21/15 10:11 AM  Result Value Ref Range   Color, UA yellow    Clarity, UA clear    Glucose, UA neg    Bilirubin, UA neg    Ketones, UA neg    Spec Grav, UA 1.020    Blood, UA neg    pH, UA 6.5    Protein, UA neg    Urobilinogen, UA 0.2    Nitrite, UA neg    Leukocytes, UA Negative     Assessment/Plan: Reactive airway disease With chest tightness and episode of pain likely due to anxiety from breathing issues. Continue albuterol PRN. Avoid wet, damp areas as this will likely exacerbate symptoms. Place a dehumidifier in the bedroom and make sure air filter is clean. Will add on singulair daily. Follow-up if symptoms not improving.

## 2015-04-13 NOTE — Patient Instructions (Signed)
Please start Singulair daily in addition to your Zyrtec and Flonase. Continue Albuterol PRN. Follow-up if allergy symptoms and breathing are not improved. We will set you up with a Pulmonologist for lung function testing. Stay out of the basement!

## 2015-04-14 DIAGNOSIS — J45909 Unspecified asthma, uncomplicated: Secondary | ICD-10-CM | POA: Insufficient documentation

## 2015-04-14 NOTE — Assessment & Plan Note (Signed)
With chest tightness and episode of pain likely due to anxiety from breathing issues. Continue albuterol PRN. Avoid wet, damp areas as this will likely exacerbate symptoms. Place a dehumidifier in the bedroom and make sure air filter is clean. Will add on singulair daily. Follow-up if symptoms not improving.

## 2015-04-23 ENCOUNTER — Encounter: Payer: Self-pay | Admitting: Physician Assistant

## 2015-04-24 ENCOUNTER — Encounter: Payer: Self-pay | Admitting: Physician Assistant

## 2015-05-06 ENCOUNTER — Ambulatory Visit (INDEPENDENT_AMBULATORY_CARE_PROVIDER_SITE_OTHER): Payer: BLUE CROSS/BLUE SHIELD | Admitting: Physician Assistant

## 2015-05-06 ENCOUNTER — Encounter: Payer: Self-pay | Admitting: Physician Assistant

## 2015-05-06 VITALS — BP 110/64 | HR 87 | Temp 98.1°F | Resp 16 | Ht 73.25 in | Wt 178.0 lb

## 2015-05-06 DIAGNOSIS — F900 Attention-deficit hyperactivity disorder, predominantly inattentive type: Secondary | ICD-10-CM

## 2015-05-06 MED ORDER — AMPHETAMINE-DEXTROAMPHETAMINE 20 MG PO TABS: 20.0000 mg | ORAL_TABLET | Freq: Two times a day (BID) | ORAL | Status: DC

## 2015-05-06 NOTE — Assessment & Plan Note (Signed)
Will switch to Adderall 20 mg BID as Adderall XR not covered. Follow-up 6 months.

## 2015-05-06 NOTE — Progress Notes (Signed)
Pre visit review using our clinic review tool, if applicable. No additional management support is needed unless otherwise documented below in the visit note/SLS  

## 2015-05-06 NOTE — Progress Notes (Signed)
    Patient presents to clinic today for medication management regarding ADD. Vyvanse 40 mg is now too expensive on insurance plan. Was working well for patient. Wants to discuss other options.  Past Medical History  Diagnosis Date  . Hay fever   . Allergy   . Migraines   . Seizures     once after concussion  . Mononucleosis     Current Outpatient Prescriptions on File Prior to Visit  Medication Sig Dispense Refill  . montelukast (SINGULAIR) 10 MG tablet Take 1 tablet (10 mg total) by mouth at bedtime. 30 tablet 3   No current facility-administered medications on file prior to visit.    No Known Allergies  Family History  Problem Relation Age of Onset  . Arthritis Mother   . Hyperlipidemia Mother   . Mental illness Mother   . Diabetes Father   . Mental illness Maternal Grandmother   . Hyperlipidemia Maternal Grandfather   . Heart disease Maternal Grandfather   . Hypertension Maternal Grandfather   . Diabetes Maternal Grandfather     Social History   Social History  . Marital Status: Single    Spouse Name: N/A  . Number of Children: N/A  . 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 Smoking status: Never Smoker   . Smokeless tobacco: Never Used  . Alcohol Use: 0.0 oz/week    0 Standard drinks or equivalent per week     Comment: soical  . Drug Use: No  . Sexual Activity:    Partners: Female   Other Topics Concern  . None   Social History Narrative    Review of Systems - See HPI.  All other ROS are negative.  BP 110/64 mmHg  Pulse 87  Temp(Src) 98.1 F (36.7 C) (Oral)  Resp 16  Ht 6' 1.25" (1.861 m)  Wt 178 lb (80.74 kg)  BMI 23.31 kg/m2  SpO2 99%  Physical Exam  Constitutional: He is well-developed, well-nourished, and in no distress.  HENT:  Head: Normocephalic and atraumatic.  Cardiovascular: Normal rate, regular rhythm, normal heart sounds and intact distal pulses.     Pulmonary/Chest: Effort normal and breath sounds normal. No respiratory distress. He has no wheezes. He has no rales. He exhibits no tenderness.  Skin: Skin is warm and dry. No rash noted.  Psychiatric: Affect normal.  Vitals reviewed.   No results found for this or any previous visit (from the past 2160 hour(s)).  Assessment/Plan: ADHD (attention deficit hyperactivity disorder) Will switch to Adderall 20 mg BID as Adderall XR not covered. Follow-up 6 months.

## 2015-05-06 NOTE — Patient Instructions (Signed)
Please start new medication as directed.  As long as you are tolerating the medication, we will follow-up in 6 months. If you notice any racing heart or jitteriness, stop the medication and call me.

## 2015-06-17 ENCOUNTER — Encounter: Payer: Self-pay | Admitting: Physician Assistant

## 2015-06-17 ENCOUNTER — Telehealth: Payer: Self-pay | Admitting: *Deleted

## 2015-06-17 NOTE — Telephone Encounter (Signed)
PA for generic Adderall 20 mg initiated. Awaiting determination. JG//CMA

## 2015-06-19 NOTE — Telephone Encounter (Signed)
PA approved effective 06/18/2015 - 06/17/2018. Approval letter sent for scanning.  Notified pt via MyChart. JG//CMA

## 2015-08-04 ENCOUNTER — Encounter: Payer: Self-pay | Admitting: Physician Assistant

## 2015-08-13 ENCOUNTER — Telehealth: Payer: Self-pay | Admitting: Physician Assistant

## 2015-08-19 ENCOUNTER — Encounter: Payer: Self-pay | Admitting: Physician Assistant

## 2015-09-01 ENCOUNTER — Ambulatory Visit: Payer: BLUE CROSS/BLUE SHIELD | Admitting: Medical

## 2015-10-12 ENCOUNTER — Encounter: Payer: Self-pay | Admitting: Physician Assistant

## 2015-10-12 ENCOUNTER — Ambulatory Visit (INDEPENDENT_AMBULATORY_CARE_PROVIDER_SITE_OTHER): Payer: BLUE CROSS/BLUE SHIELD | Admitting: Physician Assistant

## 2015-10-12 VITALS — BP 108/62 | HR 75 | Temp 97.8°F | Ht 73.25 in | Wt 181.6 lb

## 2015-10-12 DIAGNOSIS — F900 Attention-deficit hyperactivity disorder, predominantly inattentive type: Secondary | ICD-10-CM

## 2015-10-12 MED ORDER — AMPHETAMINE-DEXTROAMPHETAMINE 20 MG PO TABS: 20.0000 mg | ORAL_TABLET | Freq: Two times a day (BID) | ORAL | Status: DC

## 2015-10-12 NOTE — Progress Notes (Signed)
Pre visit review using our clinic review tool, if applicable. No additional management support is needed unless otherwise documented below in the visit note. 

## 2015-10-12 NOTE — Progress Notes (Signed)
   Patient presents to clinic today for medication management. Is currently on Adderral 20 mg BID. Endorses good relief of symptoms with current medication regimen. Denies side effect of medications. Is sleeping and eating well. Weight has remained stable. Denies concerns at today's visit.   Past Medical History  Diagnosis Date  . Hay fever   . Allergy   . Migraines   . Seizures (HCC)     once after concussion  . Mononucleosis     Current Outpatient Prescriptions on File Prior to Visit  Medication Sig Dispense Refill  . montelukast (SINGULAIR) 10 MG tablet Take 1 tablet (10 mg total) by mouth at bedtime. 30 tablet 3   No current facility-administered medications on file prior to visit.    No Known Allergies  Family History  Problem Relation Age of Onset  . Arthritis Mother   . Hyperlipidemia Mother   . Mental illness Mother   . Diabetes Father   . Mental illness Maternal Grandmother   . Hyperlipidemia Maternal Grandfather   . Heart disease Maternal Grandfather   . Hypertension Maternal Grandfather   . Diabetes Maternal Grandfather     Social History   Social History  . Marital Status: Single    Spouse Name: N/A  . Number of Children: N/A  . 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 Smoking status: Never Smoker   . Smokeless tobacco: Never Used  . Alcohol Use: 0.0 oz/week    0 Standard drinks or equivalent per week     Comment: soical  . Drug Use: No  . Sexual Activity:    Partners: Female   Other Topics Concern  . None   Social History Narrative   Review of Systems - See HPI.  All other ROS are negative.  BP 108/62 mmHg  Pulse 75  Temp(Src) 97.8 F (36.6 C) (Oral)  Ht 6' 1.25" (1.861 m)  Wt 181 lb 9.6 oz (82.373 kg)  BMI 23.78 kg/m2  SpO2 98%  Physical Exam  Constitutional: He is oriented to person, place, and time and well-developed, well-nourished, and in no distress.    HENT:  Head: Normocephalic and atraumatic.  Cardiovascular: Normal rate, regular rhythm, normal heart sounds and intact distal pulses.   Pulmonary/Chest: Effort normal and breath sounds normal.  Neurological: He is alert and oriented to person, place, and time.  Skin: Skin is warm and dry. No rash noted.  Psychiatric: Affect normal.  Vitals reviewed.  No results found for this or any previous visit (from the past 2160 hour(s)).  Assessment/Plan: ADHD (attention deficit hyperactivity disorder) Doing very well. Will continue current regimen. UDS will be obtained today.

## 2015-10-12 NOTE — Assessment & Plan Note (Signed)
Doing very well. Will continue current regimen. UDS will be obtained today.

## 2015-10-12 NOTE — Patient Instructions (Signed)
Please go to the lab to give a urine sample. This is office policy for anyone getting controlled substances.  Remember you will receive an EOB from insurance regarding the testing but you will not be billed for it.   Continue medications as directed. Follow-up with me in 6 months.

## 2015-11-09 ENCOUNTER — Encounter: Payer: Self-pay | Admitting: Physician Assistant

## 2016-01-27 ENCOUNTER — Ambulatory Visit: Payer: Self-pay | Admitting: Physician Assistant

## 2016-02-03 ENCOUNTER — Ambulatory Visit (INDEPENDENT_AMBULATORY_CARE_PROVIDER_SITE_OTHER): Payer: BLUE CROSS/BLUE SHIELD | Admitting: Physician Assistant

## 2016-02-03 ENCOUNTER — Encounter: Payer: Self-pay | Admitting: Physician Assistant

## 2016-02-03 VITALS — BP 110/72 | HR 82 | Temp 97.9°F | Resp 16 | Ht 73.25 in | Wt 178.4 lb

## 2016-02-03 DIAGNOSIS — F900 Attention-deficit hyperactivity disorder, predominantly inattentive type: Secondary | ICD-10-CM | POA: Diagnosis not present

## 2016-02-03 MED ORDER — AMPHETAMINE-DEXTROAMPHETAMINE 20 MG PO TABS: 20.0000 mg | ORAL_TABLET | Freq: Two times a day (BID) | ORAL | Status: AC

## 2016-02-03 MED ORDER — AMPHETAMINE-DEXTROAMPHETAMINE 20 MG PO TABS: 20.0000 mg | ORAL_TABLET | Freq: Two times a day (BID) | ORAL | Status: DC

## 2016-02-03 NOTE — Patient Instructions (Signed)
Please continue current medication regimen. I am glad you are doing so well. Follow-up with me in 6 months. Return sooner if you need anything.

## 2016-02-03 NOTE — Progress Notes (Signed)
Pre visit review using our clinic review tool, if applicable. No additional management support is needed unless otherwise documented below in the visit note/SLS  

## 2016-02-03 NOTE — Assessment & Plan Note (Signed)
Doing very well on current regimen. Will continue CSC on file. UDS obtained in 2/17. Patient low risk. FU 6 months.

## 2016-02-03 NOTE — Progress Notes (Signed)
    Patient presents to clinic today for follow-up of ADHD. Is currently on a regimen of Adderall 20 mg BID. Is only taking on work days and taking a break on weekends. Denies insomnia or anorexia. Denies other side effect of medication. Endorses good improvement in focus with medication regimen.   Past Medical History  Diagnosis Date  . Hay fever   . Allergy   . Migraines   . Seizures (HCC)     once after concussion  . Mononucleosis     Current Outpatient Prescriptions on File Prior to Visit  Medication Sig Dispense Refill  . amphetamine-dextroamphetamine (ADDERALL) 20 MG tablet Take 1 tablet (20 mg total) by mouth 2 (two) times daily. 60 tablet 0  . montelukast (SINGULAIR) 10 MG tablet Take 1 tablet (10 mg total) by mouth at bedtime. (Patient not taking: Reported on 02/03/2016) 30 tablet 3   No current facility-administered medications on file prior to visit.    No Known Allergies  Family History  Problem Relation Age of Onset  . Arthritis Mother   . Hyperlipidemia Mother   . Mental illness Mother   . Diabetes Father   . Mental illness Maternal Grandmother   . Hyperlipidemia Maternal Grandfather   . Heart disease Maternal Grandfather   . Hypertension Maternal Grandfather   . Diabetes Maternal Grandfather     Social History   Social History  . Marital Status: Single    Spouse Name: N/A  . Number of Children: N/A  . 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 Smoking status: Never Smoker   . Smokeless tobacco: Never Used  . Alcohol Use: 0.0 oz/week    0 Standard drinks or equivalent per week     Comment: soical  . Drug Use: No  . Sexual Activity:    Partners: Female   Other Topics Concern  . None   Social History Narrative   Review of Systems - See HPI.  All other ROS are negative.  BP 110/72 mmHg  Pulse 82  Temp(Src) 97.9 F (36.6 C) (Oral)  Resp 16  Ht 6' 1.25" (1.861 m)  Wt  178 lb 6 oz (80.91 kg)  BMI 23.36 kg/m2  SpO2 98%  Physical Exam  Constitutional: He is oriented to person, place, and time and well-developed, well-nourished, and in no distress.  HENT:  Head: Normocephalic and atraumatic.  Eyes: Conjunctivae are normal.  Cardiovascular: Normal rate, regular rhythm, normal heart sounds and intact distal pulses.   Pulmonary/Chest: Effort normal and breath sounds normal. No respiratory distress. He has no wheezes. He has no rales. He exhibits no tenderness.  Neurological: He is alert and oriented to person, place, and time.  Skin: Skin is warm and dry. No rash noted.  Psychiatric: Affect normal.  Vitals reviewed.    Assessment/Plan: ADHD (attention deficit hyperactivity disorder) Doing very well on current regimen. Will continue CSC on file. UDS obtained in 2/17. Patient low risk. FU 6 months.

## 2016-04-08 ENCOUNTER — Emergency Department (HOSPITAL_BASED_OUTPATIENT_CLINIC_OR_DEPARTMENT_OTHER): Payer: BLUE CROSS/BLUE SHIELD

## 2016-04-08 ENCOUNTER — Encounter (HOSPITAL_BASED_OUTPATIENT_CLINIC_OR_DEPARTMENT_OTHER): Payer: Self-pay | Admitting: *Deleted

## 2016-04-08 ENCOUNTER — Encounter: Payer: Self-pay | Admitting: Physician Assistant

## 2016-04-08 ENCOUNTER — Emergency Department (HOSPITAL_BASED_OUTPATIENT_CLINIC_OR_DEPARTMENT_OTHER)
Admission: EM | Admit: 2016-04-08 | Discharge: 2016-04-08 | Disposition: A | Discharge status: Home / Self Care | Payer: BLUE CROSS/BLUE SHIELD | Attending: Emergency Medicine | Admitting: Emergency Medicine

## 2016-04-08 ENCOUNTER — Telehealth: Payer: Self-pay | Admitting: Family

## 2016-04-08 DIAGNOSIS — Z79899 Other long term (current) drug therapy: Secondary | ICD-10-CM | POA: Diagnosis not present

## 2016-04-08 DIAGNOSIS — R0602 Shortness of breath: Secondary | ICD-10-CM | POA: Insufficient documentation

## 2016-04-08 LAB — BASIC METABOLIC PANEL
Anion gap: 8 (ref 5–15)
BUN: 11 mg/dL (ref 6–20)
CALCIUM: 9.5 mg/dL (ref 8.9–10.3)
CHLORIDE: 105 mmol/L (ref 101–111)
CO2: 25 mmol/L (ref 22–32)
CREATININE: 1.29 mg/dL — AB (ref 0.61–1.24)
GFR calc non Af Amer: >60 mL/min (ref >60)
GLUCOSE: 86 mg/dL (ref 65–99)
Potassium: 3.8 mmol/L (ref 3.5–5.1)
Sodium: 138 mmol/L (ref 135–145)

## 2016-04-08 LAB — CBC WITH DIFFERENTIAL/PLATELET
Basophils Absolute: 0 10*3/uL (ref 0.0–0.1)
Basophils Relative: 0 %
Eosinophils Absolute: 0.1 10*3/uL (ref 0.0–0.7)
Eosinophils Relative: 1 %
HEMATOCRIT: 45 % (ref 39.0–52.0)
HEMOGLOBIN: 16.6 g/dL (ref 13.0–17.0)
LYMPHS ABS: 2.2 10*3/uL (ref 0.7–4.0)
LYMPHS PCT: 27 %
MCH: 32.7 pg (ref 26.0–34.0)
MCHC: 36.9 g/dL — AB (ref 30.0–36.0)
MCV: 88.6 fL (ref 78.0–100.0)
MONO ABS: 0.8 10*3/uL (ref 0.1–1.0)
MONOS PCT: 10 %
NEUTROS ABS: 4.8 10*3/uL (ref 1.7–7.7)
NEUTROS PCT: 61 %
Platelets: 237 10*3/uL (ref 150–400)
RBC: 5.08 MIL/uL (ref 4.22–5.81)
RDW: 12.1 % (ref 11.5–15.5)
WBC: 7.9 10*3/uL (ref 4.0–10.5)

## 2016-04-08 LAB — D-DIMER, QUANTITATIVE: D-Dimer, Quant: <0.27 ug{FEU}/mL (ref 0.00–0.50)

## 2016-04-08 MED ORDER — LORAZEPAM 1 MG PO TABS
1.0000 mg | ORAL_TABLET | Freq: Once | ORAL | Status: AC
  Administered 2016-04-08: 1 mg via ORAL
  Filled 2016-04-08: qty 1

## 2016-04-08 MED ORDER — ALBUTEROL SULFATE (2.5 MG/3ML) 0.083% IN NEBU
5.0000 mg | INHALATION_SOLUTION | Freq: Once | RESPIRATORY_TRACT | Status: AC
  Administered 2016-04-08: 5 mg via RESPIRATORY_TRACT

## 2016-04-08 NOTE — ED Triage Notes (Signed)
Pt c/o SOB x 24 hrs

## 2016-04-08 NOTE — ED Provider Notes (Signed)
[facility] Provider Note   CSN: [id number] Arrival date & time: 04/08/16  1347  First Provider Contact:  None       History   Chief Complaint Chief Complaint  Patient presents with  . Shortness of Breath    HPI Nathan Russell is a 25 y.o. male.  Patient presents today with a chief complaint of SOB onset yesterday while at work.  He states that he has been feeling short of breath since that time, but at times it becomes worse.  He states that he feels like he is having difficulty taking a deep breath.  He reports a history of the same and was told that it was related to reactive airway disease.  He reports that he has been using his Singulair and Albuterol inhaler without improvement in his symptoms.  Denies chest pain, fever, chills, cough, hemoptysis, LE edema, dizziness, syncope, or any other symptoms.  He denies any cardiac history.  Denies prolonged travel or surgeries in the past month.  Denies history of DVT or PE.      Past Medical History:  Diagnosis Date  . Allergy   . Hay fever   . Migraines   . Mononucleosis   . Seizures (HCC)    once after concussion    Patient Active Problem List   Diagnosis Date Noted  . Reactive airway disease 04/14/2015  . Visit for preventive health examination 01/21/2015  . ADHD (attention deficit hyperactivity disorder) 01/21/2015    Past Surgical History:  Procedure Laterality Date  . TONSILLECTOMY AND ADENOIDECTOMY  2003       Home Medications    Prior to Admission medications   Medication Sig Start Date End Date Taking? Authorizing Provider  buPROPion (WELLBUTRIN XL) 300 MG 24 hr tablet Take 300 mg by mouth daily.   Yes Historical Provider, MD  amphetamine-dextroamphetamine (ADDERALL) 20 MG tablet Take 1 tablet (20 mg total) by mouth 2 (two) times daily. 02/03/16   Waldon Merl, PA-C  montelukast (SINGULAIR) 10 MG tablet Take 1 tablet (10 mg total) by mouth at bedtime. Patient not taking: Reported on  02/03/2016 04/13/15   Waldon Merl, PA-C  Multiple Vitamin (MULTIVITAMIN) tablet Take 1 tablet by mouth daily.    Historical Provider, MD    Family History Family History  Problem Relation Age of Onset  . Arthritis Mother   . Hyperlipidemia Mother   . Mental illness Mother   . Diabetes Father   . Mental illness Maternal Grandmother   . Hyperlipidemia Maternal Grandfather   . Heart disease Maternal Grandfather   . Hypertension Maternal Grandfather   . Diabetes Maternal Grandfather     Social History Social History  Substance Use Topics  . Smoking status: Never Smoker  . Smokeless tobacco: Never Used  . Alcohol use 0.0 oz/week     Comment: soical     Allergies   Review of patient's allergies indicates no known allergies.   Review of Systems Review of Systems  All other systems reviewed and are negative.    Physical Exam Updated Vital Signs BP 112/80   Pulse 91   Temp 98 F (36.7 C) (Oral)   Resp 13   Ht 6\' 1"  (1.854 m)   Wt 81.6 kg   SpO2 100%   BMI 23.75 kg/m   Physical Exam  Constitutional: He appears well-developed and well-nourished.  HENT:  Head: Normocephalic and atraumatic.  Mouth/Throat: Oropharynx is clear and moist.  Neck: Normal range of motion. Neck  supple.  Cardiovascular: Normal rate, regular rhythm and normal heart sounds.   Pulmonary/Chest: Effort normal and breath sounds normal. No respiratory distress. He has no wheezes. He has no rales.  Musculoskeletal: Normal range of motion.  No LE edema or erythema  Neurological: He is alert.  Skin: Skin is warm and dry.  Psychiatric: His mood appears anxious.  Nursing note and vitals reviewed.    ED Treatments / Results  Labs (all labs ordered are listed, but only abnormal results are displayed) Labs Reviewed  CBC WITH DIFFERENTIAL/PLATELET - Abnormal; Notable for the following:       Result Value   MCHC 36.9 (*)    All other components within normal limits  BASIC METABOLIC PANEL -  Abnormal; Notable for the following:    Creatinine, Ser 1.29 (*)    All other components within normal limits  D-DIMER, QUANTITATIVE (NOT AT [facility])    EKG  EKG Interpretation None       Radiology Dg Chest 2 View  Result Date: 04/08/2016 CLINICAL DATA:  Shortness of breath. EXAM: CHEST  2 VIEW COMPARISON:  03/31/2015 FINDINGS: The heart size and mediastinal contours are within normal limits. Both lungs are clear. The visualized skeletal structures are unremarkable. IMPRESSION: No active cardiopulmonary disease. Electronically Signed   By: Signa Kell M.D.   On: 04/08/2016 14:46    Procedures Procedures (including critical care time)  Medications Ordered in ED Medications  albuterol (PROVENTIL) (2.5 MG/3ML) 0.083% nebulizer solution 5 mg (5 mg Nebulization Given 04/08/16 1516)     Initial Impression / Assessment and Plan / ED Course  I have reviewed the triage vital signs and the nursing notes.  Pertinent labs & imaging results that were available during my care of the patient were reviewed by me and considered in my medical decision making (see chart for details).  Clinical Course   4:33 PM Reassessed patient.  He reports that his SOB has improved at this time.  Final Clinical Impressions(s) / ED Diagnoses   Final diagnoses:  SOB (shortness of breath)   Patient with a history of reactive airway disease presents today with SOB x 1 day.  Lungs CTAB.  Labs unremarkable.  CXR negative.  D-dimer negative.  Spoke with the patient's mother on the phone and she felt that his symptoms may be related to anxiety.  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  Patient does admit to feeling more anxious recently.  SOB did improve after given Ativan PO in the ED.  Feel that the patient is stable for discharge.  He reports having an appointment scheduled with his PCP on Monday (3 days.)  Return precautions given. New Prescriptions New  Prescriptions   No medications on file     Santiago Glad, PA-C 04/10/16 0147    Gwyneth Sprout, MD 04/17/16 2200

## 2016-04-08 NOTE — ED Notes (Signed)
PA at bedside.

## 2016-04-08 NOTE — ED Notes (Signed)
Pt made aware to return if symptoms worsen or if any life threatening symptoms occur.   

## 2016-04-08 NOTE — Telephone Encounter (Signed)
Per chart review, pt is currently at [facility] ED.

## 2016-04-08 NOTE — Telephone Encounter (Signed)
I would recommend that patient been seen in the office or urgent care today.

## 2016-04-11 ENCOUNTER — Encounter: Payer: Self-pay | Admitting: Physician Assistant

## 2016-04-11 ENCOUNTER — Ambulatory Visit (INDEPENDENT_AMBULATORY_CARE_PROVIDER_SITE_OTHER): Payer: BLUE CROSS/BLUE SHIELD | Admitting: Physician Assistant

## 2016-04-11 VITALS — BP 114/72 | HR 106 | Temp 98.5°F | Resp 16 | Ht 73.0 in | Wt 178.1 lb

## 2016-04-11 DIAGNOSIS — F4323 Adjustment disorder with mixed anxiety and depressed mood: Secondary | ICD-10-CM

## 2016-04-11 MED ORDER — MONTELUKAST SODIUM 10 MG PO TABS: 10.0000 mg | ORAL_TABLET | Freq: Every day | ORAL | 3 refills | Status: DC

## 2016-04-11 MED ORDER — FLUOXETINE HCL 20 MG PO TABS: 20.0000 mg | ORAL_TABLET | Freq: Every day | ORAL | 3 refills | Status: AC

## 2016-04-11 NOTE — Assessment & Plan Note (Signed)
Will begin Fluoxetine 20 mg -- he is to take 1/2 tablet (10 mg) once daily for 1 week. Then increase to 1 tablet (20 mg) daily. Continue Wellbutrin as directed by specialist. Start counseling as directed. He is to FU with specialist as scheduled who will likely take over Rx. He is to FU here in 1 month if not assessed by specialist. Alarm signs/symptoms that would prompt return visit or ER assessment discussed. Patient voices understanding.

## 2016-04-11 NOTE — Patient Instructions (Signed)
Please continue your Wellbutrin as directed. Start the Prozac taking 1/2 tablet once daily for 1 week before increasing to 1 tablet daily.   [facility] with the [facility] for [facility] management and counseling.  If they are not taking all medications over for anxiety, follow-up with me in 1 month.  If you note any worsening symptoms, come see me immediately.

## 2016-04-11 NOTE — Progress Notes (Signed)
Patient presents to clinic today for ER follow-up of SOB. Patient seen in ER on 04/08/16 for worsening SOB and chest tightness associated with anxiety. Labs included EKG, CXR, labs and D-dimer, all unremarkable. Patient was given Ativan in the ER with significant improvement in symptoms. Discharged with follow-up here in office.   Patient endorses doing well overall since discharge. Denies recurrence of chest tightness or SOB. Does note increased generalized anxiety, mainly dealing with work and home stressors. Notes living at home with parents and 5 other siblings which is causing a lot of stress for him. Patient is followed by the [facility] in [place], currently on Wellbutrin XL 300 mg daily for depressed mood. Endorses mood has improved significantly with this regimen. He has FU scheduled for late August. Will be starting counseling as well. Denies SI/HI.  Past Medical History:  Diagnosis Date  . Allergy   . Hay fever   . Migraines   . Mononucleosis   . Seizures ([place])    once after concussion    Current Outpatient Prescriptions on File Prior to Visit  Medication Sig Dispense Refill  . amphetamine-dextroamphetamine (ADDERALL) 20 MG tablet Take 1 tablet (20 mg total) by mouth 2 (two) times daily. 60 tablet 0  . buPROPion (WELLBUTRIN XL) 300 MG 24 hr tablet Take 300 mg by mouth daily.    . Multiple Vitamin (MULTIVITAMIN) tablet Take 1 tablet by mouth daily.     No current facility-administered medications on file prior to visit.     No Known Allergies  Family History  Problem Relation Age of Onset  . Arthritis Mother   . Hyperlipidemia Mother   . Mental illness Mother   . Diabetes Father   . Mental illness Maternal Grandmother   . Hyperlipidemia Maternal Grandfather   . Heart disease Maternal Grandfather   . Hypertension Maternal Grandfather   . Diabetes Maternal Grandfather     Social History   Social History  . Marital status: Single    Spouse name: N/A  . Number of  children: N/A  . 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 Smoking status: Never Smoker  . Smokeless tobacco: Never Used  . Alcohol use 0.0 oz/week     Comment: soical  . Drug use: No  . Sexual activity: Yes    Partners: Female   Other Topics Concern  . None   Social History Narrative  . None   Review of Systems - See HPI.  All other ROS are negative.  BP 114/72   Pulse (!) 106   Temp 98.5 F (36.9 C) (Oral)   Resp 16   Ht _0  (1.854 m)   Wt 178 lb 2 oz (80.8 kg)   SpO2 98%   BMI 23.50 kg/m   Physical Exam  Constitutional: He is oriented to person, place, and time and well-developed, well-nourished, and in no distress.  HENT:  Head: Normocephalic and atraumatic.  Eyes: Conjunctivae are normal.  Neck: Neck supple. No thyromegaly present.  Cardiovascular: Normal rate, regular rhythm, normal heart sounds and intact distal pulses.   Pulmonary/Chest: Effort normal and breath sounds normal. No respiratory distress. He has no wheezes. He has no rales. He exhibits no tenderness.  Neurological: He is alert and oriented to person, place, and time.  Skin: Skin is warm. No rash noted.  Psychiatric: His mood appears anxious. He does not exhibit a depressed mood.  He expresses no homicidal and no suicidal ideation. He expresses no suicidal plans and no homicidal plans.  Vitals reviewed.   Recent Results (from the past 2160 hour(s))  CBC with Differential/Platelet     Status: Abnormal   Collection Time: 04/08/16  2:55 PM  Result Value Ref Range   WBC 7.9 4.0 - 10.5 K/uL   RBC 5.08 4.22 - 5.81 MIL/uL   Hemoglobin 16.6 13.0 - 17.0 g/dL   HCT 45.0 39.0 - 52.0 %   MCV 88.6 78.0 - 100.0 fL   MCH 32.7 26.0 - 34.0 pg   MCHC 36.9 (H) 30.0 - 36.0 g/dL   RDW 12.1 11.5 - 15.5 %   Platelets 237 150 - 400 K/uL   Neutrophils Relative % 61 %   Neutro Abs 4.8 1.7 - 7.7 K/uL   Lymphocytes Relative 27 %   Lymphs  Abs 2.2 0.7 - 4.0 K/uL   Monocytes Relative 10 %   Monocytes Absolute 0.8 0.1 - 1.0 K/uL   Eosinophils Relative 1 %   Eosinophils Absolute 0.1 0.0 - 0.7 K/uL   Basophils Relative 0 %   Basophils Absolute 0.0 0.0 - 0.1 K/uL  Basic metabolic panel     Status: Abnormal   Collection Time: 04/08/16  2:55 PM  Result Value Ref Range   Sodium 138 135 - 145 mmol/L   Potassium 3.8 3.5 - 5.1 mmol/L   Chloride 105 101 - 111 mmol/L   CO2 25 22 - 32 mmol/L   Glucose, Bld 86 65 - 99 mg/dL   BUN 11 6 - 20 mg/dL   Creatinine, Ser 1.29 (H) 0.61 - 1.24 mg/dL   Calcium 9.5 8.9 - 10.3 mg/dL   GFR calc non Af Amer >60 >60 mL/min   GFR calc Af Amer >60 >60 mL/min    Comment: (NOTE) The eGFR has been calculated using the CKD EPI equation. This calculation has not been validated in all clinical situations. eGFR's persistently <60 mL/min signify possible Chronic Kidney Disease.    Anion gap 8 5 - 15  D-dimer, quantitative (not at [facility])     Status: None   Collection Time: 04/08/16  2:55 PM  Result Value Ref Range   D-Dimer, Quant <0.27 0.00 - 0.50 ug/mL-FEU    Comment: (NOTE) At the manufacturer cut-off of 0.50 ug/mL FEU, this assay has been documented to exclude PE with a sensitivity and negative predictive value of 97 to 99%.  At this time, this assay has not been approved by the FDA to exclude DVT/VTE. Results should be correlated with clinical presentation.     Assessment/Plan: Adjustment disorder with mixed anxiety and depressed mood Will begin Fluoxetine 20 mg -- he is to take 1/2 tablet (10 mg) once daily for 1 week. Then increase to 1 tablet (20 mg) daily. Continue Wellbutrin as directed by specialist. Start counseling as directed. He is to FU with specialist as scheduled who will likely take over Rx. He is to FU here in 1 month if not assessed by specialist. Alarm signs/symptoms that would prompt return visit or ER assessment discussed. Patient voices understanding.    Leeanne Rio, PA-C

## 2016-06-08 ENCOUNTER — Encounter: Payer: Self-pay | Admitting: Physician Assistant

## 2016-06-08 ENCOUNTER — Ambulatory Visit (INDEPENDENT_AMBULATORY_CARE_PROVIDER_SITE_OTHER): Payer: BLUE CROSS/BLUE SHIELD | Admitting: Physician Assistant

## 2016-06-08 VITALS — BP 126/84 | HR 84 | Temp 97.7°F | Resp 16 | Ht 73.0 in | Wt 173.0 lb

## 2016-06-08 DIAGNOSIS — R21 Rash and other nonspecific skin eruption: Secondary | ICD-10-CM

## 2016-06-08 MED ORDER — METHYLPREDNISOLONE 4 MG PO TBPK: ORAL_TABLET | ORAL | 0 refills | Status: DC

## 2016-06-08 NOTE — Patient Instructions (Signed)
Please take the steroid as directed. Benadryl or Claritin will help with itch. Cool compresses or Sarna lotion will also help.  Really pay attention to soaps, lotions and detergents. If this goes away with medication but recurs, then it is something you are coming into contact with consistently.

## 2016-06-08 NOTE — Progress Notes (Signed)
Pre visit review using our clinic review tool, if applicable. No additional management support is needed unless otherwise documented below in the visit note/SLS  

## 2016-06-08 NOTE — Progress Notes (Signed)
Patient presents to clinic today c/o 2 days of pruritic rash first starting on bilateral torso/flanks. Noted this morning on waking that rash had spread to thighs bilaterally. Endorses itching without pain, blister or drainage. Denies change to foods, soaps or lotions. Denies insect bite. Denies change in medication. Denies fever, chills, malaise or fatigue. Has tried OTC antihistamines with only some relief in symptoms.   Past Medical History:  Diagnosis Date  . Allergy   . Hay fever   . Migraines   . Mononucleosis   . Seizures ([place])    once after concussion    Current Outpatient Prescriptions on File Prior to Visit  Medication Sig Dispense Refill  . albuterol (PROVENTIL HFA;VENTOLIN HFA) 108 (90 Base) MCG/ACT inhaler Inhale 1-2 puffs into the lungs every 6 (six) hours as needed for wheezing or shortness of breath.    . amphetamine-dextroamphetamine (ADDERALL) 20 MG tablet Take 1 tablet (20 mg total) by mouth 2 (two) times daily. 60 tablet 0  . buPROPion (WELLBUTRIN XL) 300 MG 24 hr tablet Take 300 mg by mouth daily.    Marland Kitchen FLUoxetine (PROZAC) 20 MG tablet Take 1 tablet (20 mg total) by mouth daily. 30 tablet 3  . montelukast (SINGULAIR) 10 MG tablet Take 1 tablet (10 mg total) by mouth at bedtime. 30 tablet 3  . Multiple Vitamin (MULTIVITAMIN) tablet Take 1 tablet by mouth daily.     No current facility-administered medications on file prior to visit.     No Known Allergies  Family History  Problem Relation Age of Onset  . Arthritis Mother   . Hyperlipidemia Mother   . Mental illness Mother   . Diabetes Father   . Mental illness Maternal Grandmother   . Hyperlipidemia Maternal Grandfather   . Heart disease Maternal Grandfather   . Hypertension Maternal Grandfather   . Diabetes Maternal Grandfather     Social History   Social History  . Marital status: Single    Spouse name: N/A  . Number of children: N/A  . 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 Smoking status: Never Smoker  . Smokeless tobacco: Never Used  . Alcohol use 0.0 oz/week     Comment: soical  . Drug use: No  . Sexual activity: Yes    Partners: Female   Other Topics Concern  . None   Social History Narrative  . None   Review of Systems - See HPI.  All other ROS are negative.  BP 126/84 (BP Location: Right Arm, Patient Position: Sitting, Cuff Size: Normal)   Pulse 84   Temp 97.7 F (36.5 C) (Oral)   Resp 16   Ht 6' 1"  (1.854 m)   Wt 173 lb (78.5 kg)   SpO2 98%   BMI 22.82 kg/m   Physical Exam  Constitutional: He is well-developed, well-nourished, and in no distress.  HENT:  Head: Normocephalic and atraumatic.  Eyes: Conjunctivae are normal.  Cardiovascular: Normal rate, regular rhythm, normal heart sounds and intact distal pulses.   Pulmonary/Chest: Effort normal and breath sounds normal. No respiratory distress. He has no wheezes. He has no rales. He exhibits no tenderness.  Skin: Skin is warm and dry.     Psychiatric: Affect normal.  Vitals reviewed.   Recent Results (from the past 2160 hour(s))  CBC with Differential/Platelet     Status: Abnormal   Collection Time: 04/08/16  2:55 PM  Result  Value Ref Range   WBC 7.9 4.0 - 10.5 K/uL   RBC 5.08 4.22 - 5.81 MIL/uL   Hemoglobin 16.6 13.0 - 17.0 g/dL   HCT 45.0 39.0 - 52.0 %   MCV 88.6 78.0 - 100.0 fL   MCH 32.7 26.0 - 34.0 pg   MCHC 36.9 (H) 30.0 - 36.0 g/dL   RDW 12.1 11.5 - 15.5 %   Platelets 237 150 - 400 K/uL   Neutrophils Relative % 61 %   Neutro Abs 4.8 1.7 - 7.7 K/uL   Lymphocytes Relative 27 %   Lymphs Abs 2.2 0.7 - 4.0 K/uL   Monocytes Relative 10 %   Monocytes Absolute 0.8 0.1 - 1.0 K/uL   Eosinophils Relative 1 %   Eosinophils Absolute 0.1 0.0 - 0.7 K/uL   Basophils Relative 0 %   Basophils Absolute 0.0 0.0 - 0.1 K/uL  Basic metabolic panel     Status: Abnormal   Collection Time: 04/08/16  2:55 PM  Result  Value Ref Range   Sodium 138 135 - 145 mmol/L   Potassium 3.8 3.5 - 5.1 mmol/L   Chloride 105 101 - 111 mmol/L   CO2 25 22 - 32 mmol/L   Glucose, Bld 86 65 - 99 mg/dL   BUN 11 6 - 20 mg/dL   Creatinine, Ser 1.29 (H) 0.61 - 1.24 mg/dL   Calcium 9.5 8.9 - 10.3 mg/dL   GFR calc non Af Amer >60 >60 mL/min   GFR calc Af Amer >60 >60 mL/min    Comment: (NOTE) The eGFR has been calculated using the CKD EPI equation. This calculation has not been validated in all clinical situations. eGFR's persistently <60 mL/min signify possible Chronic Kidney Disease.    Anion gap 8 5 - 15  D-dimer, quantitative (not at [facility])     Status: None   Collection Time: 04/08/16  2:55 PM  Result Value Ref Range   D-Dimer, Quant <0.27 0.00 - 0.50 ug/mL-FEU    Comment: (NOTE) At the manufacturer cut-off of 0.50 ug/mL FEU, this assay has been documented to exclude PE with a sensitivity and negative predictive value of 97 to 99%.  At this time, this assay has not been approved by the FDA to exclude DVT/VTE. Results should be correlated with clinical presentation.     Assessment/Plan: 1. Rash and nonspecific skin eruption Discussed potential etiologies -- contact dermatitis the frontrunner. Discussed avoidance of triggers. Keep a journal of soaps, lotions, detergents used. Rx Medrol pack as directed. Supportive measures and OTC medications reviewed.   - methylPREDNISolone (MEDROL DOSEPAK) 4 MG TBPK tablet; Take following package directions  Dispense: 21 tablet; Refill: 0   Leeanne Rio, [place]

## 2016-08-18 ENCOUNTER — Telehealth: Payer: Self-pay | Admitting: Physician Assistant

## 2016-08-18 ENCOUNTER — Emergency Department (HOSPITAL_BASED_OUTPATIENT_CLINIC_OR_DEPARTMENT_OTHER): Payer: BLUE CROSS/BLUE SHIELD

## 2016-08-18 ENCOUNTER — Ambulatory Visit (INDEPENDENT_AMBULATORY_CARE_PROVIDER_SITE_OTHER): Payer: BLUE CROSS/BLUE SHIELD | Admitting: Medical

## 2016-08-18 ENCOUNTER — Encounter: Payer: Self-pay | Admitting: Medical

## 2016-08-18 ENCOUNTER — Encounter (HOSPITAL_BASED_OUTPATIENT_CLINIC_OR_DEPARTMENT_OTHER): Payer: Self-pay | Admitting: *Deleted

## 2016-08-18 ENCOUNTER — Observation Stay (HOSPITAL_BASED_OUTPATIENT_CLINIC_OR_DEPARTMENT_OTHER)
Admission: EM | Admit: 2016-08-18 | Discharge: 2016-08-19 | Disposition: A | Discharge status: Home / Self Care | Payer: BLUE CROSS/BLUE SHIELD | Attending: Cardiology | Admitting: Cardiology

## 2016-08-18 VITALS — BP 102/72 | HR 80 | Temp 98.1°F | Ht 73.0 in | Wt 174.8 lb

## 2016-08-18 DIAGNOSIS — R072 Precordial pain: Principal | ICD-10-CM | POA: Insufficient documentation

## 2016-08-18 DIAGNOSIS — R0781 Pleurodynia: Secondary | ICD-10-CM | POA: Diagnosis present

## 2016-08-18 DIAGNOSIS — Z79899 Other long term (current) drug therapy: Secondary | ICD-10-CM | POA: Diagnosis not present

## 2016-08-18 DIAGNOSIS — R0602 Shortness of breath: Secondary | ICD-10-CM | POA: Insufficient documentation

## 2016-08-18 DIAGNOSIS — R079 Chest pain, unspecified: Secondary | ICD-10-CM

## 2016-08-18 DIAGNOSIS — F909 Attention-deficit hyperactivity disorder, unspecified type: Secondary | ICD-10-CM | POA: Diagnosis present

## 2016-08-18 DIAGNOSIS — R748 Abnormal levels of other serum enzymes: Secondary | ICD-10-CM | POA: Diagnosis not present

## 2016-08-18 DIAGNOSIS — F411 Generalized anxiety disorder: Secondary | ICD-10-CM

## 2016-08-18 DIAGNOSIS — R7989 Other specified abnormal findings of blood chemistry: Secondary | ICD-10-CM | POA: Diagnosis present

## 2016-08-18 DIAGNOSIS — R6884 Jaw pain: Secondary | ICD-10-CM | POA: Diagnosis not present

## 2016-08-18 DIAGNOSIS — R0600 Dyspnea, unspecified: Secondary | ICD-10-CM

## 2016-08-18 DIAGNOSIS — R0789 Other chest pain: Secondary | ICD-10-CM | POA: Diagnosis not present

## 2016-08-18 DIAGNOSIS — R778 Other specified abnormalities of plasma proteins: Secondary | ICD-10-CM | POA: Diagnosis present

## 2016-08-18 HISTORY — DX: Pleurodynia: R07.81

## 2016-08-18 HISTORY — DX: Other specified behavioral and emotional disorders with onset usually occurring in childhood and adolescence: F98.8

## 2016-08-18 LAB — COMPREHENSIVE METABOLIC PANEL
ALBUMIN: 5.2 g/dL — AB (ref 3.5–5.0)
ALK PHOS: 59 U/L (ref 38–126)
ALT: 20 U/L (ref 17–63)
ANION GAP: 9 (ref 5–15)
AST: 21 U/L (ref 15–41)
BILIRUBIN TOTAL: 0.9 mg/dL (ref 0.3–1.2)
BUN: 13 mg/dL (ref 6–20)
CALCIUM: 9.8 mg/dL (ref 8.9–10.3)
CO2: 27 mmol/L (ref 22–32)
Chloride: 101 mmol/L (ref 101–111)
Creatinine, Ser: 1.15 mg/dL (ref 0.61–1.24)
GFR calc Af Amer: >60 mL/min (ref >60)
GLUCOSE: 82 mg/dL (ref 65–99)
Potassium: 3.9 mmol/L (ref 3.5–5.1)
Sodium: 137 mmol/L (ref 135–145)
TOTAL PROTEIN: 8.2 g/dL — AB (ref 6.5–8.1)

## 2016-08-18 LAB — CBC WITH DIFFERENTIAL/PLATELET
BASOS PCT: 0 %
Basophils Absolute: 0 10*3/uL (ref 0.0–0.1)
Eosinophils Absolute: 0.1 10*3/uL (ref 0.0–0.7)
Eosinophils Relative: 2 %
HEMATOCRIT: 46 % (ref 39.0–52.0)
HEMOGLOBIN: 16.5 g/dL (ref 13.0–17.0)
LYMPHS ABS: 1.7 10*3/uL (ref 0.7–4.0)
LYMPHS PCT: 22 %
MCH: 32.4 pg (ref 26.0–34.0)
MCHC: 35.9 g/dL (ref 30.0–36.0)
MCV: 90.4 fL (ref 78.0–100.0)
MONO ABS: 0.7 10*3/uL (ref 0.1–1.0)
MONOS PCT: 10 %
NEUTROS ABS: 5 10*3/uL (ref 1.7–7.7)
NEUTROS PCT: 66 %
Platelets: 217 10*3/uL (ref 150–400)
RBC: 5.09 MIL/uL (ref 4.22–5.81)
RDW: 12.1 % (ref 11.5–15.5)
WBC: 7.5 10*3/uL (ref 4.0–10.5)

## 2016-08-18 LAB — BRAIN NATRIURETIC PEPTIDE: B Natriuretic Peptide: 3.4 pg/mL (ref 0.0–100.0)

## 2016-08-18 LAB — RAPID URINE DRUG SCREEN, HOSP PERFORMED
Amphetamines: POSITIVE — AB
BARBITURATES: NOT DETECTED
Benzodiazepines: NOT DETECTED
Cocaine: NOT DETECTED
Opiates: NOT DETECTED
TETRAHYDROCANNABINOL: NOT DETECTED

## 2016-08-18 LAB — D-DIMER, QUANTITATIVE: D-Dimer, Quant: 0.37 ug/mL-FEU (ref 0.00–0.50)

## 2016-08-18 LAB — TROPONIN I: TROPONIN I: 0.17 ng/mL — AB (ref <0.03)

## 2016-08-18 MED ORDER — IOPAMIDOL (ISOVUE-370) INJECTION 76%
100.0000 mL | Freq: Once | INTRAVENOUS | Status: AC | PRN
  Administered 2016-08-18: 100 mL via INTRAVENOUS

## 2016-08-18 MED ORDER — AMPHETAMINE-DEXTROAMPHETAMINE 20 MG PO TABS: 20.0000 mg | ORAL_TABLET | Freq: Two times a day (BID) | ORAL | Status: DC

## 2016-08-18 MED ORDER — SODIUM CHLORIDE 0.9% FLUSH
3.0000 mL | Freq: Two times a day (BID) | INTRAVENOUS | Status: DC
  Administered 2016-08-18 – 2016-08-19 (×2): 3 mL via INTRAVENOUS

## 2016-08-18 MED ORDER — HEPARIN (PORCINE) IN NACL 100-0.45 UNIT/ML-% IJ SOLN
950.0000 [IU]/h | INTRAMUSCULAR | Status: DC
  Administered 2016-08-18: 950 [IU]/h via INTRAVENOUS
  Filled 2016-08-18: qty 250

## 2016-08-18 MED ORDER — IOPAMIDOL (ISOVUE-300) INJECTION 61%: 100.0000 mL | Freq: Once | INTRAVENOUS | Status: DC | PRN

## 2016-08-18 MED ORDER — GI COCKTAIL ~~LOC~~
30.0000 mL | Freq: Once | ORAL | Status: AC
  Administered 2016-08-18: 30 mL via ORAL
  Filled 2016-08-18: qty 30

## 2016-08-18 MED ORDER — HEPARIN BOLUS VIA INFUSION
4000.0000 [IU] | Freq: Once | INTRAVENOUS | Status: AC
  Administered 2016-08-18: 4000 [IU] via INTRAVENOUS

## 2016-08-18 MED ORDER — ADULT MULTIVITAMIN W/MINERALS CH
1.0000 | ORAL_TABLET | Freq: Every day | ORAL | Status: DC
  Administered 2016-08-19: 1 via ORAL
  Filled 2016-08-18: qty 1

## 2016-08-18 MED ORDER — IBUPROFEN 200 MG PO TABS
200.0000 mg | ORAL_TABLET | Freq: Three times a day (TID) | ORAL | Status: DC
  Administered 2016-08-19: 200 mg via ORAL
  Filled 2016-08-18: qty 1

## 2016-08-18 MED ORDER — BUPROPION HCL ER (XL) 150 MG PO TB24
300.0000 mg | ORAL_TABLET | Freq: Every day | ORAL | Status: DC
  Administered 2016-08-19: 300 mg via ORAL
  Filled 2016-08-18: qty 2

## 2016-08-18 MED ORDER — KETOROLAC TROMETHAMINE 30 MG/ML IJ SOLN
30.0000 mg | Freq: Once | INTRAMUSCULAR | Status: AC
  Administered 2016-08-18: 30 mg via INTRAVENOUS
  Filled 2016-08-18: qty 1

## 2016-08-18 MED ORDER — ASPIRIN 81 MG PO CHEW
324.0000 mg | CHEWABLE_TABLET | Freq: Once | ORAL | Status: AC
  Administered 2016-08-18: 324 mg via ORAL
  Filled 2016-08-18: qty 4

## 2016-08-18 MED ORDER — MONTELUKAST SODIUM 10 MG PO TABS
10.0000 mg | ORAL_TABLET | Freq: Every day | ORAL | Status: DC
  Administered 2016-08-18: 10 mg via ORAL
  Filled 2016-08-18: qty 1

## 2016-08-18 MED ORDER — FLUOXETINE HCL 20 MG PO CAPS
20.0000 mg | ORAL_CAPSULE | Freq: Every day | ORAL | Status: DC
  Administered 2016-08-19: 20 mg via ORAL
  Filled 2016-08-18 (×2): qty 1

## 2016-08-18 NOTE — Telephone Encounter (Signed)
[facility]. Patient scheduled today with Ramon DredgeEdward at 1:15. Malva CoganCody Martin is aware.

## 2016-08-18 NOTE — ED Triage Notes (Signed)
Chest and jaw pain this am. States he feels like he cannot get a full breath.

## 2016-08-18 NOTE — Patient Instructions (Addendum)
With your recent jaw pain, upper chest pain, and dyspnea I do think it is important to get studies done through ED. You would benefit from basic labs, d-dimer, chest-xray and possible heart protein study.(ED MD would determined further work up).  In august you had work up and then they gave you ativan. But I think it is best to studies before assuming symptoms associated with stress or anxiety.  I called the ED PA and notified her of your presentation. Please go down stairs for further work up now.  [facility]  or with me  . If this work up is negative may be good idea to give you low dose/low number of benzodiazepine.

## 2016-08-18 NOTE — Telephone Encounter (Signed)
Patient called stating that he has broken out in a cold sweat and is having intense jaw pain for the last 20 mins. Sent to [facility].

## 2016-08-18 NOTE — ED Notes (Signed)
ED Provider at bedside. 

## 2016-08-18 NOTE — Telephone Encounter (Signed)
Pt was placed on Whole FoodsEdward Saguier, PA-C schedule for 1:15 pm today (08/18/16).  [facility] [place].

## 2016-08-18 NOTE — Telephone Encounter (Signed)
Patient Name: Nathan Russell DOB: 1991-06-15 Initial Comment broken out in cold sweat, had jaw pain, started 20 min. ago as very intense, now is mild Nurse Assessment Nurse: Elijah Birkaldwell, RN, Stark BrayLynda Date/Time (Eastern Time): 08/18/2016 10:22:09 AM Confirm and document reason for call. If symptomatic, describe symptoms. ---Caller states today he broke out in cold sweat, had lower jaw pain on both sides, started 20 min. ago as very intense, now is mild - a 3-4/10. Thought it was maybe anxiety, but not like his usual symptoms. Does the patient have any new or worsening symptoms? ---Yes Will a triage be completed? ---Yes Related visit to physician within the last 2 weeks? ---No Does the PT have any chronic conditions? (i.e. diabetes, asthma, etc.) ---Yes List chronic conditions. ---anxiety, depression, ADHD Is this a behavioral health or substance abuse call? ---No Guidelines Guideline Title Affirmed Question Affirmed Notes Face Pain [1] Sharp severe pain(s) AND [2] lasting for seconds to minutes AND [3] now gone Final Disposition User See PCP When Office is Open (within 3 days) Elijah Birkaldwell, Charity fundraiserN, Stark BrayLynda Disagree/Comply: Danella Maiersomply

## 2016-08-18 NOTE — H&P (Addendum)
CARDIOLOGY H&P  CC: Chest pain  HPI: Nathan Russell is a 25 yo M with no past medical history presenting with 1 day of cp radiating to the jaw.   Nathan Russell has a subacute history (6 months) of SOB and pleuritic cp for which he was seen in the ER on 04/08/2016. He reports that his SOB and pleuritic cp come on suddenly, not provoked by exercise, not relieved by rest, and can last hours.   He had similar SOB and pleuritic cp which began this morning. He describes his pain as sharp and originating underneath his sternum and radiating to his jaw bilaterally. His symptoms are not exertional or positional. He denies DOE, palpitations, orthpnea, PND, lower extermity swelling, claudication, stroke/TIA symptoms, rash, joint pain. He uses Ibuprofen rarely, does not believe his pain is GERD.  He presented to the ER as his symptoms were abating slowly over hours but were not resolving. His Trop was 0.17, CTA Chest negative for PE, aorta appears normal in size.  On transfer, he is on a Heparin gtt and is denies SOB or cp.   He denies smoking, recreational drug use.   No family history of CHD, SCD, dissections.   He had a viral illness 1 week ago.   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  Review of Systems:     Cardiac Review of Systems: {Y] = yes [ ]  = no  Chest Pain [X]   Resting SOB [X]  Exertional SOB  [  ]  Orthopnea [  ]   Pedal Edema [   ]    Palpitations [  ] Syncope  [  ]   Presyncope [   ]  General Review of Systems: [Y] = yes [  ]=no Constitional: recent weight change [  ]; anorexia [X] ; fatigue [  ]; nausea [  ]; night sweats [  ]; fever [  ]; or chills [  ];                                                                     Dental: poor dentition[  ];   Eye : blurred vision [  ]; diplopia [   ]; vision changes [  ];  Amaurosis fugax[  ]; Resp: cough [  ];  wheezing[  ];  hemoptysis[  ]; shortness of breath[  ]; paroxysmal nocturnal dyspnea[  ]; dyspnea on exertion[  ]; or  orthopnea[  ];  GI:  gallstones[  ], vomiting[  ];  dysphagia[  ]; melena[  ];  hematochezia [  ]; heartburn[  ];   GU: kidney stones [  ]; hematuria[  ];   dysuria [  ];  nocturia[  ];               Skin: rash [  ], swelling[  ];, hair loss[  ];  peripheral edema[  ];  or itching[  ]; Musculosketetal: myalgias[  ];  joint swelling[  ];  joint erythema[  ];  joint pain[  ];  back pain[  ];  Heme/Lymph: bruising[  ];  bleeding[  ];  anemia[  ];  Neuro: TIA[  ];  headaches[  ];  stroke[  ];  vertigo[  ];  seizures[  ];   paresthesias[  ];  difficulty walking[  ];  Psych:depression[X] ; anxiety[X] ;  Endocrine: diabetes[  ];  thyroid dysfunction[  ];  Other:  Past Medical History:  Diagnosis Date  . ADD (attention deficit disorder)   . Allergy   . Hay fever   . Migraines   . Mononucleosis   . Seizures ([place])    once after concussion    Prior to Admission medications   Medication Sig Start Date End Date Taking? Authorizing Provider  albuterol (PROVENTIL HFA;VENTOLIN HFA) 108 (90 Base) MCG/ACT inhaler Inhale 1-2 puffs into the lungs every 6 (six) hours as needed for wheezing or shortness of breath.   Yes Historical Provider, MD  amphetamine-dextroamphetamine (ADDERALL) 20 MG tablet Take 1 tablet (20 mg total) by mouth 2 (two) times daily. 02/03/16  Yes Brunetta Jeans, PA-C  aspirin-acetaminophen-caffeine (EXCEDRIN MIGRAINE) [phone] MG tablet Take 1 tablet by mouth every 6 (six) hours as needed for headache.   Yes Historical Provider, MD  buPROPion (WELLBUTRIN XL) 300 MG 24 hr tablet Take 300 mg by mouth daily.   Yes Historical Provider, MD  FLUoxetine (PROZAC) 20 MG tablet Take 1 tablet (20 mg total) by mouth daily. 04/11/16  Yes Brunetta Jeans, PA-C  ibuprofen (ADVIL,MOTRIN) 200 MG tablet Take 200-400 mg by mouth every 6 (six) hours as needed for headache.   Yes Historical Provider, MD  montelukast (SINGULAIR) 10 MG tablet Take 1 tablet (10 mg total) by mouth at bedtime. 04/11/16  Yes  Brunetta Jeans, PA-C  Multiple Vitamin (MULTIVITAMIN) tablet Take 1 tablet by mouth daily.   Yes Historical Provider, MD    No Known Allergies  Social History   Social History  . Marital status: Single    Spouse name: N/A  . Number of children: N/A  . Years of education: N/A   Occupational History  . Financial Advisor Nathan Russell   Social History Main Topics  . Smoking status: Never Smoker  . Smokeless tobacco: Never Used  . Alcohol use 0.0 oz/week     Comment: soical  . Drug use: No  . Sexual activity: Yes    Partners: Female   Other Topics Concern  . Not on file   Social History Narrative  . No narrative on file    Family History  Problem Relation Age of Onset  . Arthritis Mother   . Hyperlipidemia Mother   . Mental illness Mother   . Diabetes Father   . Mental illness Maternal Grandmother   . Hyperlipidemia Maternal Grandfather   . Heart disease Maternal Grandfather   . Hypertension Maternal Grandfather   . Diabetes Maternal Grandfather     PHYSICAL EXAM: Vitals:   08/18/16 2000 08/18/16 2209  BP: 118/87 117/80  Pulse: 89 73  Resp: 16 18  Temp:  98.2 F (36.8 C)   General:  Well appearing. No respiratory difficulty HEENT: normal Neck: supple. no JVD. Carotids 2+ bilat; no bruits. No lymphadenopathy or thryomegaly appreciated. Cor: PMI nondisplaced. Regular rate & rhythm. No rubs, gallops or murmurs. Lungs: clear Abdomen: soft, nontender, nondistended. No hepatosplenomegaly. No bruits or masses. Good bowel sounds. Extremities: no cyanosis, clubbing, rash, edema Neuro: alert & oriented x 3, cranial nerves grossly intact. moves all 4 extremities w/o difficulty. Affect pleasant.  ECG: Sinus tachycardia, borderline RAE, borderline RAD, no ST or T changes  Results for orders placed or performed during the hospital encounter of 08/18/16 (from the past 24 hour(s))  CBC with Differential/Platelet  Status: None   Collection Time: 08/18/16  3:20 PM    Result Value Ref Range   WBC 7.5 4.0 - 10.5 K/uL   RBC 5.09 4.22 - 5.81 MIL/uL   Hemoglobin 16.5 13.0 - 17.0 g/dL   HCT 46.0 39.0 - 52.0 %   MCV 90.4 78.0 - 100.0 fL   MCH 32.4 26.0 - 34.0 pg   MCHC 35.9 30.0 - 36.0 g/dL   RDW 12.1 11.5 - 15.5 %   Platelets 217 150 - 400 K/uL   Neutrophils Relative % 66 %   Neutro Abs 5.0 1.7 - 7.7 K/uL   Lymphocytes Relative 22 %   Lymphs Abs 1.7 0.7 - 4.0 K/uL   Monocytes Relative 10 %   Monocytes Absolute 0.7 0.1 - 1.0 K/uL   Eosinophils Relative 2 %   Eosinophils Absolute 0.1 0.0 - 0.7 K/uL   Basophils Relative 0 %   Basophils Absolute 0.0 0.0 - 0.1 K/uL  Comprehensive metabolic panel     Status: Abnormal   Collection Time: 08/18/16  3:20 PM  Result Value Ref Range   Sodium 137 135 - 145 mmol/L   Potassium 3.9 3.5 - 5.1 mmol/L   Chloride 101 101 - 111 mmol/L   CO2 27 22 - 32 mmol/L   Glucose, Bld 82 65 - 99 mg/dL   BUN 13 6 - 20 mg/dL   Creatinine, Ser 1.15 0.61 - 1.24 mg/dL   Calcium 9.8 8.9 - 10.3 mg/dL   Total Protein 8.2 (H) 6.5 - 8.1 g/dL   Albumin 5.2 (H) 3.5 - 5.0 g/dL   AST 21 15 - 41 U/L   ALT 20 17 - 63 U/L   Alkaline Phosphatase 59 38 - 126 U/L   Total Bilirubin 0.9 0.3 - 1.2 mg/dL   GFR calc non Af Amer >60 >60 mL/min   GFR calc Af Amer >60 >60 mL/min   Anion gap 9 5 - 15  Troponin I     Status: Abnormal   Collection Time: 08/18/16  3:20 PM  Result Value Ref Range   Troponin I 0.17 (HH) <0.03 ng/mL  D-dimer, quantitative (not at [facility])     Status: None   Collection Time: 08/18/16  3:20 PM  Result Value Ref Range   D-Dimer, Quant 0.37 0.00 - 0.50 ug/mL-FEU  Brain natriuretic peptide     Status: None   Collection Time: 08/18/16  3:20 PM  Result Value Ref Range   B Natriuretic Peptide 3.4 0.0 - 100.0 pg/mL  Rapid urine drug screen (hospital performed)     Status: Abnormal   Collection Time: 08/18/16  4:05 PM  Result Value Ref Range   Opiates NONE DETECTED NONE DETECTED   Cocaine NONE DETECTED NONE DETECTED    Benzodiazepines NONE DETECTED NONE DETECTED   Amphetamines POSITIVE (A) NONE DETECTED   Tetrahydrocannabinol NONE DETECTED NONE DETECTED   Barbiturates NONE DETECTED NONE DETECTED   Dg Chest 2 View  Result Date: 08/18/2016 CLINICAL DATA:  Chest and jaw pain for several hours, initial encounter EXAM: CHEST  2 VIEW COMPARISON:  04/08/2016 FINDINGS: The heart size and mediastinal contours are within normal limits. Both lungs are clear. The visualized skeletal structures are unremarkable. IMPRESSION: No active cardiopulmonary disease. Electronically Signed   By: Inez Catalina M.D.   On: 08/18/2016 15:21   Ct Angio Chest Pe W And/or Wo Contrast  Result Date: 08/18/2016 CLINICAL DATA:  Cough chest and jaw pain this morning. Shortness of breath. EXAM: CT ANGIOGRAPHY  CHEST WITH CONTRAST TECHNIQUE: Multidetector CT imaging of the chest was performed using the standard protocol during bolus administration of intravenous contrast. Multiplanar CT image reconstructions and MIPs were obtained to evaluate the vascular anatomy. CONTRAST:  100 cc Isovue 370 IV COMPARISON:  Chest x-ray earlier today FINDINGS: Cardiovascular: Heart is normal size. Aorta is normal caliber. No filling defects in the pulmonary arteries to suggest pulmonary emboli. Mediastinum/Nodes: No mediastinal, hilar, or axillary adenopathy. Lungs/Pleura: Lungs are clear. No focal airspace opacities or suspicious nodules. No effusions. Upper Abdomen: Imaging into the upper abdomen shows no acute findings. Musculoskeletal: Chest wall soft tissues are unremarkable.No acute bony abnormality or focal bone lesion. Review of the MIP images confirms the above findings. IMPRESSION: No evidence of pulmonary embolus.  No acute cardiopulmonary disease. Electronically Signed   By: Rolm Baptise M.D.   On: 08/18/2016 16:44   ASSESSMENT: 25 yo M with no past medical history presenting with acute on chronic atypical cp radiating to the jaw.   PLAN/DISCUSSION: #)  Positive troponin - unclear etiology. He has been ruled out for PE, symptoms and demographics atypical for dissection (coronary or aortic) or coronary artery disease. Ddx includes myopericarditis, Takotsubo's, valve disease, and non-cardiac pain.  Dx: - CBC, Chem 7, TSH, ESR, Troponin - Utox positive only for amphetamies - TTE in AM r/o structural heart disease - his borderline RAD and RAE are interesting Tx: - Stop Heparin gtt given very low probability of ACS - Toradol IV x 1, Iburprofen  #) Psych - depression, anxiety, ADHD - Continue home meds including Adderall (given very low probability of ACS)  #) DVT Ppx - ambulatory  FULL CODE  Allyson Sabal, MD  ADDENDUM:  Surprisingly, his second Troponin returned at 0.73 (previous 0.17).   He still denies any cp, SOB, or any other symptoms.  I've ordered another EKG and made him NPO. ESR is pending.  In the absence of ST-T changes, I continue to feel that we can hold off on ASA, a statin, and Heparin gtt given his young age and description of symptoms, which make acute coronary syndrome/plaque rupture unlikely. I have discussed this with Mr. Dutter and his Mo who are in agreement.  If his TTE is normal (no effusion, no structural heart disease), consider CMRI or CT Coronary angiogram (has already gotten 100 cc IV dye) prior to [facility].   Allyson Sabal, MD 5:05 AM

## 2016-08-18 NOTE — Progress Notes (Signed)
ANTICOAGULATION CONSULT NOTE  Pharmacy Consult for heparin Indication: chest pain/ACS  Heparin Dosing Weight: 79.3 kg   Assessment: 25 yom with jaw pain, CP on admit. Pharmacy consulted to dose heparin for ACS. Not on anticoagulation PTA. D-dimer WNL. CTA negative for PE. CBC wnl, no bleed documented.  Goal of Therapy:  Heparin level 0.3-0.7 units/ml Monitor platelets by anticoagulation protocol: Yes   Plan:  Heparin 4000 unit bolus Start heparin at 950 units/h 6h heparin level Daily heparin level/CBC Monitor s/sx bleeding   Nathan BertinHaley Travonte Russell, PharmD, BCPS Clinical Pharmacist 08/18/2016 5:04 PM

## 2016-08-18 NOTE — ED Provider Notes (Signed)
[facility] Provider Note   CSN: [id number] Arrival date & time: 08/18/16  1439     History   Chief Complaint Chief Complaint  Patient presents with  . Chest Pain    HPI Nathan Russell is a 25 y.o. male.  25 yo M with a chief complaint of chest pain. The started this morning. The patient felt that the pain radiated up into his neck. He has had pain like this before and had a negative d-dimer at that time. He went to his family physician's office where they are concerned that he might have a PE and was sent down for a d-dimer. Patient denies any lower externally swelling denies recent stay in the hospital denies hemoptysis denies testosterone use denies long travel. Patient denies history of PE or DVT. Denies history of MI in his family denies smoking denies hypertension denies hyperlipidemia denies diabetes.   The history is provided by the patient.  Chest Pain   This is a recurrent problem. The current episode started 6 to 12 hours ago. The problem occurs constantly. The problem has not changed since onset.The pain is present in the substernal region. The pain is moderate. The quality of the pain is described as sharp. The pain radiates to the left neck and right neck. Duration of episode(s) is 6 hours. Associated symptoms include shortness of breath. Pertinent negatives include no abdominal pain, no fever, no headaches, no palpitations and no vomiting. He has tried nothing for the symptoms. The treatment provided no relief.    Past Medical History:  Diagnosis Date  . ADD (attention deficit disorder)   . Allergy   . Hay fever   . Migraines   . Mononucleosis   . Seizures (HCC)    once after concussion    Patient Active Problem List   Diagnosis Date Noted  . Chest pain with high risk for cardiac etiology 08/18/2016  . Adjustment disorder with mixed anxiety and depressed mood 04/11/2016  . Reactive airway disease 04/14/2015  . Visit for preventive health  examination 01/21/2015  . ADHD (attention deficit hyperactivity disorder) 01/21/2015    Past Surgical History:  Procedure Laterality Date  . TONSILLECTOMY AND ADENOIDECTOMY  2003       Home Medications    Prior to Admission medications   Medication Sig Start Date End Date Taking? Authorizing Provider  albuterol (PROVENTIL HFA;VENTOLIN HFA) 108 (90 Base) MCG/ACT inhaler Inhale 1-2 puffs into the lungs every 6 (six) hours as needed for wheezing or shortness of breath.    Historical Provider, MD  amphetamine-dextroamphetamine (ADDERALL) 20 MG tablet Take 1 tablet (20 mg total) by mouth 2 (two) times daily. 02/03/16   Waldon MerlWilliam C Martin, PA-C  buPROPion (WELLBUTRIN XL) 300 MG 24 hr tablet Take 300 mg by mouth daily.    Historical Provider, MD  FLUoxetine (PROZAC) 20 MG tablet Take 1 tablet (20 mg total) by mouth daily. 04/11/16   Waldon MerlWilliam C Martin, PA-C  montelukast (SINGULAIR) 10 MG tablet Take 1 tablet (10 mg total) by mouth at bedtime. 04/11/16   Waldon MerlWilliam C Martin, PA-C  Multiple Vitamin (MULTIVITAMIN) tablet Take 1 tablet by mouth daily.    Historical Provider, MD    Family History Family History  Problem Relation Age of Onset  . Arthritis Mother   . Hyperlipidemia Mother   . Mental illness Mother   . Diabetes Father   . Mental illness Maternal Grandmother   . Hyperlipidemia Maternal Grandfather   . Heart disease Maternal Grandfather   .  Hypertension Maternal Grandfather   . Diabetes Maternal Grandfather     Social History Social History  Substance Use Topics  . Smoking status: Never Smoker  . Smokeless tobacco: Never Used  . Alcohol use 0.0 oz/week     Comment: soical     Allergies   Patient has no known allergies.   Review of Systems Review of Systems  Constitutional: Negative for chills and fever.  HENT: Negative for congestion and facial swelling.   Eyes: Negative for discharge and visual disturbance.  Respiratory: Positive for shortness of breath.     Cardiovascular: Positive for chest pain. Negative for palpitations.  Gastrointestinal: Negative for abdominal pain, diarrhea and vomiting.  Musculoskeletal: Negative for arthralgias and myalgias.  Skin: Negative for color change and rash.  Neurological: Negative for tremors, syncope and headaches.  Psychiatric/Behavioral: Negative for confusion and dysphoric mood.     Physical Exam Updated Vital Signs BP 117/82   Pulse 81   Temp 97.9 F (36.6 C) (Oral)   Resp 19   Ht 6\' 1"  (1.854 m)   Wt 174 lb 12.8 oz (79.3 kg)   SpO2 100%   BMI 23.06 kg/m   Physical Exam  Constitutional: He is oriented to person, place, and time. He appears well-developed and well-nourished.  HENT:  Head: Normocephalic and atraumatic.  Eyes: EOM are normal. Pupils are equal, round, and reactive to light.  Neck: Normal range of motion. Neck supple. No JVD present.  Cardiovascular: Normal rate and regular rhythm.  Exam reveals no gallop and no friction rub.   No murmur heard. Pulmonary/Chest: No respiratory distress. He has no wheezes.  Abdominal: He exhibits no distension. There is no rebound and no guarding.  Musculoskeletal: Normal range of motion.  Neurological: He is alert and oriented to person, place, and time.  Skin: No rash noted. No pallor.  Psychiatric: He has a normal mood and affect. His behavior is normal.  Nursing note and vitals reviewed.    ED Treatments / Results  Labs (all labs ordered are listed, but only abnormal results are displayed) Labs Reviewed  COMPREHENSIVE METABOLIC PANEL - Abnormal; Notable for the following:       Result Value   Total Protein 8.2 (*)    Albumin 5.2 (*)    All other components within normal limits  TROPONIN I - Abnormal; Notable for the following:    Troponin I 0.17 (*)    All other components within normal limits  RAPID URINE DRUG SCREEN, HOSP PERFORMED - Abnormal; Notable for the following:    Amphetamines POSITIVE (*)    All other components  within normal limits  CBC WITH DIFFERENTIAL/PLATELET  D-DIMER, QUANTITATIVE (NOT AT [facility])  BRAIN NATRIURETIC PEPTIDE  HEPARIN LEVEL (UNFRACTIONATED)  HEPARIN LEVEL (UNFRACTIONATED)  CBC    EKG  EKG Interpretation  Date/Time:  Thursday August 18 2016 14:49:35 EST Ventricular Rate:  104 PR Interval:  142 QRS Duration: 98 QT Interval:  340 QTC Calculation: 447 R Axis:   87 Text Interpretation:  Sinus tachycardia Right atrial enlargement Borderline ECG No significant change since last tracing Confirmed by Lauralee Waters MD, DANIEL [phone]) on 08/18/2016 3:36:34 PM       Radiology Dg Chest 2 View  Result Date: 08/18/2016 CLINICAL DATA:  Chest and jaw pain for several hours, initial encounter EXAM: CHEST  2 VIEW COMPARISON:  04/08/2016 FINDINGS: The heart size and mediastinal contours are within normal limits. Both lungs are clear. The visualized skeletal structures are unremarkable. IMPRESSION: No active cardiopulmonary  disease. Electronically Signed   By: Alcide Clever M.D.   On: 08/18/2016 15:21   Ct Angio Chest Pe W And/or Wo Contrast  Result Date: 08/18/2016 CLINICAL DATA:  Cough chest and jaw pain this morning. Shortness of breath. EXAM: CT ANGIOGRAPHY CHEST WITH CONTRAST TECHNIQUE: Multidetector CT imaging of the chest was performed using the standard protocol during bolus administration of intravenous contrast. Multiplanar CT image reconstructions and MIPs were obtained to evaluate the vascular anatomy. CONTRAST:  100 cc Isovue 370 IV COMPARISON:  Chest x-ray earlier today FINDINGS: Cardiovascular: Heart is normal size. Aorta is normal caliber. No filling defects in the pulmonary arteries to suggest pulmonary emboli. Mediastinum/Nodes: No mediastinal, hilar, or axillary adenopathy. Lungs/Pleura: Lungs are clear. No focal airspace opacities or suspicious nodules. No effusions. Upper Abdomen: Imaging into the upper abdomen shows no acute findings. Musculoskeletal: Chest wall soft tissues are  unremarkable.No acute bony abnormality or focal bone lesion. Review of the MIP images confirms the above findings. IMPRESSION: No evidence of pulmonary embolus.  No acute cardiopulmonary disease. Electronically Signed   By: Charlett Nose M.D.   On: 08/18/2016 16:44    Procedures Procedures (including critical care time)  Medications Ordered in ED Medications  heparin ADULT infusion 100 units/mL (25000 units/2103mL sodium chloride 0.45%) (950 Units/hr Intravenous New Bag/Given 08/18/16 1717)  gi cocktail (Maalox,Lidocaine,Donnatal) (30 mLs Oral Given 08/18/16 1552)  aspirin chewable tablet 324 mg (324 mg Oral Given 08/18/16 1716)  iopamidol (ISOVUE-370) 76 % injection 100 mL (100 mLs Intravenous Contrast Given 08/18/16 1636)  heparin bolus via infusion 4,000 Units (4,000 Units Intravenous Bolus from Bag 08/18/16 1717)     Initial Impression / Assessment and Plan / ED Course  I have reviewed the triage vital signs and the nursing notes.  Pertinent labs & imaging results that were available during my care of the patient were reviewed by me and considered in my medical decision making (see chart for details).  Clinical Course     25 yo M With a chief complaint of chest pain. Sounds likely reflux based on his history. Sent down to obtain a d-dimer.  Patient had a positive troponin. D-dimer was negative. Due to the patient having much more likely a PE then ACS a CT chest was ordered. Negative for PE. No pleural effusion. I discussed the case with cardiology, Dr. Anne Fu. He will place the patient in observation and obtain an echo. I started the patient on heparin. He was given aspirin.  CRITICAL CARE Performed by: Rae Roam   Total critical care time: 35 minutes  Critical care time was exclusive of separately billable procedures and treating other patients.  Critical care was necessary to treat or prevent imminent or life-threatening deterioration.  Critical care was time  spent personally by me on the following activities: development of treatment plan with patient and/or surrogate as well as nursing, discussions with consultants, evaluation of patient's response to treatment, examination of patient, obtaining history from patient or surrogate, ordering and performing treatments and interventions, ordering and review of laboratory studies, ordering and review of radiographic studies, pulse oximetry and re-evaluation of patient's condition.  The patients results and plan were reviewed and discussed.   Any x-rays performed were independently reviewed by myself.   Differential diagnosis were considered with the presenting HPI.  Medications  heparin ADULT infusion 100 units/mL (25000 units/245mL sodium chloride 0.45%) (950 Units/hr Intravenous New Bag/Given 08/18/16 1717)  gi cocktail (Maalox,Lidocaine,Donnatal) (30 mLs Oral Given 08/18/16 1552)  aspirin chewable  tablet 324 mg (324 mg Oral Given 08/18/16 1716)  iopamidol (ISOVUE-370) 76 % injection 100 mL (100 mLs Intravenous Contrast Given 08/18/16 1636)  heparin bolus via infusion 4,000 Units (4,000 Units Intravenous Bolus from Bag 08/18/16 1717)    Vitals:   08/18/16 1447 08/18/16 1600 08/18/16 1700 08/18/16 1730  BP:  119/76 133/89 117/82  Pulse:  90 96 81  Resp:   15 19  Temp:      TempSrc:      SpO2:  100% 100% 100%  Weight:      Height: 6\' 1"  (1.854 m)       Final diagnoses:  Chest pain with high risk for cardiac etiology    Admission/ observation were discussed with the admitting physician, patient and/or family and they are comfortable with the plan.   Final Clinical Impressions(s) / ED Diagnoses   Final diagnoses:  Chest pain with high risk for cardiac etiology    New Prescriptions New Prescriptions   No medications on file     Melene PlanDan Alphonza Tramell, DO 08/18/16 1847

## 2016-08-18 NOTE — Progress Notes (Signed)
Subjective:    Patient ID: Nathan Russell, male    DOB: 05/20/1991, 25 y.o.   MRN: [id number]  HPI  Pt in stating today at work he had acute onset of sweating and some  Upper neck area pain/under jaw and some pain upper chest(he points to suprasternal notch area.  Pt states he still feels like has cold sweats. Patient states a little faint pain in upper chest and under jaw. Currently level discomfort under jaw 3-4/10. Upper chest discomfort 5/10. Pt has asthma. He feels like he can't take full deep breath. Pt tried inhaler but it did not help much. Pt does admit some stress at work. Pt states recently very stressed. New job since march. Pt does not smoke. No drug use.   Pt is on prozac and and wellbutrin.  With aitivity upper chest area hurst more and feels little more sob.  No leg pain or swelling.  No family of history of sudden death at young age or cardiac issues.  Pt on adderal presently. Only one cup coffee today.     Review of Systems  HENT: Negative for congestion, ear pain, hearing loss, mouth sores, rhinorrhea, sinus pain and sinus pressure.   Respiratory: Positive for cough and shortness of breath. Negative for chest tightness and wheezing.   Cardiovascular: Negative for chest pain and palpitations.  Gastrointestinal: Negative for abdominal pain and vomiting.  Musculoskeletal: Negative for back pain.       No leg pain.  Skin: Negative for rash.  Hematological: Negative for adenopathy. Does not bruise/bleed easily.  Psychiatric/Behavioral: Negative for behavioral problems and confusion.    Past Medical History:  Diagnosis Date  . Allergy   . Hay fever   . Migraines   . Mononucleosis   . Seizures (HCC)    once after concussion     Social History   Social History  . Marital status: Single    Spouse name: N/A  . Number of children: N/A  . Years of education: N/A   Occupational History  . Financial Advisor [employer]   Social History Main Topics  .  Smoking status: Never Smoker  . Smokeless tobacco: Never Used  . Alcohol use 0.0 oz/week     Comment: soical  . Drug use: No  . Sexual activity: Yes    Partners: Female   Other Topics Concern  . Not on file   Social History Narrative  . No narrative on file    Past Surgical History:  Procedure Laterality Date  . TONSILLECTOMY AND ADENOIDECTOMY  2003    Family History  Problem Relation Age of Onset  . Arthritis Mother   . Hyperlipidemia Mother   . Mental illness Mother   . Diabetes Father   . Mental illness Maternal Grandmother   . Hyperlipidemia Maternal Grandfather   . Heart disease Maternal Grandfather   . Hypertension Maternal Grandfather   . Diabetes Maternal Grandfather     No Known Allergies  Current Outpatient Prescriptions on File Prior to Visit  Medication Sig Dispense Refill  . albuterol (PROVENTIL HFA;VENTOLIN HFA) 108 (90 Base) MCG/ACT inhaler Inhale 1-2 puffs into the lungs every 6 (six) hours as needed for wheezing or shortness of breath.    . amphetamine-dextroamphetamine (ADDERALL) 20 MG tablet Take 1 tablet (20 mg total) by mouth 2 (two) times daily. 60 tablet 0  . buPROPion (WELLBUTRIN XL) 300 MG 24 hr tablet Take 300 mg by mouth daily.    Marland Kitchen. FLUoxetine (PROZAC) 20 MG  tablet Take 1 tablet (20 mg total) by mouth daily. 30 tablet 3  . montelukast (SINGULAIR) 10 MG tablet Take 1 tablet (10 mg total) by mouth at bedtime. 30 tablet 3  . Multiple Vitamin (MULTIVITAMIN) tablet Take 1 tablet by mouth daily.     No current facility-administered medications on file prior to visit.     BP 102/72 (BP Location: Right Arm, Patient Position: Sitting, Cuff Size: Normal)   Pulse 80   Temp 98.1 F (36.7 C) (Oral)   Ht 6\' 1"  (1.854 m)   Wt 174 lb 12.8 oz (79.3 kg)   SpO2 98%   BMI 23.06 kg/m       Objective:   Physical Exam  General Mental Status- Alert. General Appearance- Not in acute distress.   HEENT Head- Normal. Ear Auditory Canal - Left-  Normal. Right - Normal.Tympanic Membrane- Left- Normal. Right- Normal. Eye Sclera/Conjunctiva- Left- Normal. Right- Normal. Nose & Sinuses Nasal Mucosa- Left-  Not Boggy and Congested. Right-  Not Boggy and  Congested.Bilateral  No maxillary and  No frontal sinus pressure. Mouth & Throat Lips: Upper Lip- Normal: no dryness, cracking, pallor, cyanosis, or vesicular eruption. Lower Lip-Normal: no dryness, cracking, pallor, cyanosis or vesicular eruption. Buccal Mucosa- Bilateral- No Aphthous ulcers. Oropharynx- No Discharge or Erythema. Tonsils: Characteristics- Bilateral- No Erythema or Congestion. Size/Enlargement- Bilateral- No enlargement. Discharge- bilateral-None. Under mandible faint tender but no obvious swollen nodes.  Neck Neck- Supple. No Masses.no tracheal deviation.   Chest and Lung Exam Auscultation: Breath Sounds:-Clear even and unlabored.  Cardiovascular Auscultation:Rythm- Regular, rate and rhythm. Murmurs & Other Heart Sounds:Ausculatation of the heart reveal- No Murmurs.  Lymphatic Head & Neck General Head & Neck Lymphatics: Bilateral: Description- No Localized lymphadenopathy.   Abdomen Inspection:-Inspeection Normal. Palpation/Percussion:Note:No mass. Palpation and Percussion of the abdomen reveal- Non Tender, Non Distended + BS, no rebound or guarding.  Anterior chest- has faint tenderness to palpation clavicle and suprasternal notch area. Normal appearance to chest wall.   Neurologic Cranial Nerve exam:- CN III-XII intact(No nystagmus), symmetric smile. Strength:- 5/5 equal and symmetric strength both upper and lower extremities.      Assessment & Plan:  With your recent jaw pain, upper chest pain, and dyspnea I do think it is important to get studies done through ED. You would benefit from basic labs, d-dimer, chest-xray and possible heart protein study.(ED MD would determined further work up).  In august you had work up and then they gave you ativan.  But I think it is best to studies before assuming symptoms associated with stress or anxiety.  I called the ED PA  and notified her of your presentation. Please go down stairs for further work up now.  [facility]  or with me  . If this work up is negative may be good idea to give you low dose/low number of benzodiazepine.  Hien Perreira, Ramon DredgeEdward, PA-C

## 2016-08-18 NOTE — Plan of Care (Signed)
Problem: Pain Managment: Goal: General experience of comfort will improve Outcome: Progressing Pt is CP at this time. Cardiologist has seen patient and has implemented a plan of care. Heparin has been stopped per MD order.

## 2016-08-19 ENCOUNTER — Encounter (HOSPITAL_COMMUNITY): Payer: Self-pay | Admitting: Radiology

## 2016-08-19 ENCOUNTER — Observation Stay (HOSPITAL_BASED_OUTPATIENT_CLINIC_OR_DEPARTMENT_OTHER): Payer: BLUE CROSS/BLUE SHIELD

## 2016-08-19 ENCOUNTER — Observation Stay (HOSPITAL_COMMUNITY): Payer: BLUE CROSS/BLUE SHIELD

## 2016-08-19 DIAGNOSIS — R0602 Shortness of breath: Secondary | ICD-10-CM | POA: Diagnosis not present

## 2016-08-19 DIAGNOSIS — R748 Abnormal levels of other serum enzymes: Secondary | ICD-10-CM

## 2016-08-19 DIAGNOSIS — R0781 Pleurodynia: Secondary | ICD-10-CM | POA: Diagnosis not present

## 2016-08-19 DIAGNOSIS — R079 Chest pain, unspecified: Secondary | ICD-10-CM

## 2016-08-19 DIAGNOSIS — R072 Precordial pain: Secondary | ICD-10-CM | POA: Diagnosis not present

## 2016-08-19 DIAGNOSIS — R7989 Other specified abnormal findings of blood chemistry: Secondary | ICD-10-CM

## 2016-08-19 DIAGNOSIS — R778 Other specified abnormalities of plasma proteins: Secondary | ICD-10-CM | POA: Diagnosis present

## 2016-08-19 LAB — ECHOCARDIOGRAM COMPLETE
CHL CUP RV SYS PRESS: 23 mmHg
CHL CUP TV REG PEAK VELOCITY: 223 cm/s
EWDT: 232 ms
FS: 42 % (ref 28–44)
Height: 73 in
IVS/LV PW RATIO, ED: 1
LA ID, A-P, ES: 28 mm
LA diam end sys: 28 mm
LADIAMINDEX: 1.4 cm/m2
LAVOLA4C: 28 mL
LDCA: 5.31 cm2
LV TDI E'LATERAL: 10.9
LV e' LATERAL: 10.9 cm/s
LVOT diameter: 26 mm
Lateral S' vel: 8.7 cm/s
MV Dec: 232
MVPKEVEL: 1.1 m/s
PW: 10 mm — AB (ref 0.6–1.1)
RV TAPSE: 11.9 mm
TDI e' medial: 12.6
TR max vel: 223 cm/s
Weight: 2684.8 oz

## 2016-08-19 LAB — BASIC METABOLIC PANEL
Anion gap: 7 (ref 5–15)
BUN: 10 mg/dL (ref 6–20)
CHLORIDE: 103 mmol/L (ref 101–111)
CO2: 25 mmol/L (ref 22–32)
CREATININE: 1.22 mg/dL (ref 0.61–1.24)
Calcium: 9.5 mg/dL (ref 8.9–10.3)
GFR calc Af Amer: >60 mL/min (ref >60)
GFR calc non Af Amer: >60 mL/min (ref >60)
GLUCOSE: 97 mg/dL (ref 65–99)
POTASSIUM: 3.8 mmol/L (ref 3.5–5.1)
Sodium: 135 mmol/L (ref 135–145)

## 2016-08-19 LAB — SEDIMENTATION RATE: Sed Rate: 1 mm/hr (ref 0–16)

## 2016-08-19 LAB — CBC
HCT: 46.1 % (ref 39.0–52.0)
Hemoglobin: 16.8 g/dL (ref 13.0–17.0)
MCH: 32.9 pg (ref 26.0–34.0)
MCHC: 36.4 g/dL — ABNORMAL HIGH (ref 30.0–36.0)
MCV: 90.2 fL (ref 78.0–100.0)
PLATELETS: 209 10*3/uL (ref 150–400)
RBC: 5.11 MIL/uL (ref 4.22–5.81)
RDW: 12.3 % (ref 11.5–15.5)
WBC: 7.5 10*3/uL (ref 4.0–10.5)

## 2016-08-19 LAB — TSH: TSH: 2.263 u[IU]/mL (ref 0.350–4.500)

## 2016-08-19 LAB — TROPONIN I
TROPONIN I: 0.73 ng/mL — AB (ref <0.03)
Troponin I: 0.5 ng/mL (ref <0.03)

## 2016-08-19 MED ORDER — METOPROLOL TARTRATE 5 MG/5ML IV SOLN
5.0000 mg | INTRAVENOUS | Status: DC | PRN
  Administered 2016-08-19 (×3): 5 mg via INTRAVENOUS

## 2016-08-19 MED ORDER — IBUPROFEN 800 MG PO TABS
800.0000 mg | ORAL_TABLET | Freq: Three times a day (TID) | ORAL | Status: DC
  Administered 2016-08-19: 800 mg via ORAL
  Filled 2016-08-19: qty 1

## 2016-08-19 MED ORDER — METOPROLOL TARTRATE 5 MG/5ML IV SOLN
INTRAVENOUS | Status: AC
  Administered 2016-08-19: 5 mg
  Filled 2016-08-19: qty 20

## 2016-08-19 MED ORDER — IOPAMIDOL (ISOVUE-370) INJECTION 76%
INTRAVENOUS | Status: AC
  Administered 2016-08-19: 80 mL
  Filled 2016-08-19: qty 100

## 2016-08-19 MED ORDER — DILTIAZEM HCL ER COATED BEADS 120 MG PO CP24
120.0000 mg | ORAL_CAPSULE | Freq: Every day | ORAL | Status: DC
  Administered 2016-08-19: 120 mg via ORAL
  Filled 2016-08-19: qty 1

## 2016-08-19 MED ORDER — NITROGLYCERIN 0.4 MG SL SUBL
SUBLINGUAL_TABLET | SUBLINGUAL | Status: AC
  Administered 2016-08-19: 0.4 mg
  Filled 2016-08-19: qty 2

## 2016-08-19 MED ORDER — OMEPRAZOLE 20 MG PO CPDR: 20.0000 mg | DELAYED_RELEASE_CAPSULE | Freq: Every day | ORAL | 0 refills | Status: DC

## 2016-08-19 MED ORDER — SODIUM CHLORIDE 0.9 % IV SOLN
INTRAVENOUS | Status: AC
  Administered 2016-08-19: 13:00:00 via INTRAVENOUS

## 2016-08-19 MED ORDER — DILTIAZEM HCL ER COATED BEADS 120 MG PO CP24: 120.0000 mg | ORAL_CAPSULE | Freq: Every day | ORAL | 6 refills | Status: AC

## 2016-08-19 MED ORDER — IBUPROFEN 200 MG PO TABS: 200.0000 mg | ORAL_TABLET | ORAL | Status: AC

## 2016-08-19 NOTE — Discharge Summary (Signed)
Discharge Summary    Patient ID: Nathan Oxfordndrew Lieder,  MRN: [id number], DOB/AGE: May 24, 1991 25 y.o.  Admit date: 08/18/2016 Discharge date: 08/19/2016  Primary Care Provider: Piedad ClimesMartin, William Cody Primary Cardiologist: Dr. Herbie BaltimoreHarding  Discharge Diagnoses    Principal Problem:   Elevated troponin Active Problems:   ADHD (attention deficit hyperactivity disorder)   Pleuritic chest pain - possible coronary vasospasm versus pericarditis    Diagnostic Studies/Procedures    1. 2D echo 08/19/16 Study Conclusions  - Left ventricle: The cavity size was normal. Systolic function was   normal. The estimated ejection fraction was in the range of 60%   to 65%. Wall motion was normal; there were no regional wall   motion abnormalities. Left ventricular diastolic function   parameters were normal. - Aortic valve: Transvalvular velocity was within the normal range.   There was no stenosis. There was no regurgitation. - Mitral valve: Transvalvular velocity was within the normal range.   There was no evidence for stenosis. There was trivial   regurgitation. - Right ventricle: The cavity size was normal. Wall thickness was   normal. Systolic function was normal. - Tricuspid valve: There was mild regurgitation. - Pulmonary arteries: Systolic pressure was within the normal   range. PA peak pressure: 28 mm Hg (S).  2. Coronary CT as below.  _____________     History of Present Illness     Nathan Russell is a 25 y.o. male with history of ADD, migraines, h/o remote seizure after concussion who presented [facility] with 1 day history of chest pain radiating to the jaw with elevated troponin. He has a subacute history (6 months) of SOB and pleuritic CP for which he was seen in the ER on 04/08/2016. He reports that his SOB and pleuritic CP come on suddenly, not provoked by exercise, not relieved by rest, and can last hours. He had symptoms the morning of admission, with sharp CP originating underneath his  sternum and radiating to his jaw bilaterally. His symptoms were not exertional or positional. He denied DOE, palpitations, orthpnea, PND, lower extermity swelling, claudication, stroke/TIA symptoms, rash, joint pain. He presented to the ER as his symptoms were abating slowly over hours but were not resolving. Initial troponin was 0.17 and thus he was admitted for further workup.  Hospital Course    CTA Chest was negative for PE and aorta appeared normal in size. He denied any recreational drugs. UDS + amphetamines but he is on adderall, otherwise negative. No family history of CHD, SCD, dissections. He had a viral illness 1 week ago. 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 He was placed on heparin per pharmacy. Troponin peaked at 0.73, then next value 0.5. This was not felt to represent ACS. Top differential included coronary vasospasm related to Adderall versus stress, or alternatively possible pericarditis. 2D echo 08/19/16: EF 60-65%, no RWMA, normal diastolic parameters, mild TR. Cardiac CT today showed calcium score of zero, no evidence of CAD, normal appearing pericardium, no pericardial effusion. The patient was started on diltiazem for possible spasm. He will also be treated with a short course NSAID taper (along with PPI for GI protection while taking this). See med list/DC instructions below. Dr. Herbie BaltimoreHarding did not think the patient necessarily needed to stop his Adderall at present time. The patient feels well today. Dr. Herbie BaltimoreHarding has seen and examined the patient today and feels he is stable for discharge. Dr. Herbie BaltimoreHarding feels the patient may return to work on Monday 08/22/16 if  feeling well. I have sent a message to our scheduler requesting a follow-up appointment, and our office will call the patient with this information.  _____________  Discharge Vitals Blood pressure 98/76, pulse 75, temperature 97.9 F (36.6 C), temperature source Oral, resp. rate 15, height 6\' 1"  (1.854 m), weight 167 lb  12.8 oz (76.1 kg), SpO2 100 %.  Filed Weights   08/18/16 1444 08/18/16 2106 08/19/16 0500  Weight: 174 lb 12.8 oz (79.3 kg) 168 lb 6.4 oz (76.4 kg) 167 lb 12.8 oz (76.1 kg)    Labs & Radiologic Studies    CBC  Recent Labs  08/18/16 1520 08/19/16 0315  WBC 7.5 7.5  NEUTROABS 5.0  --   HGB 16.5 16.8  HCT 46.0 46.1  MCV 90.4 90.2  PLT 217 209   Basic Metabolic Panel  Recent Labs  08/18/16 1520 08/19/16 0315  NA 137 135  K 3.9 3.8  CL 101 103  CO2 27 25  GLUCOSE 82 97  BUN 13 10  CREATININE 1.15 1.22  CALCIUM 9.8 9.5   Liver Function Tests  Recent Labs  08/18/16 1520  AST 21  ALT 20  ALKPHOS 59  BILITOT 0.9  PROT 8.2*  ALBUMIN 5.2*   Cardiac Enzymes  Recent Labs  08/18/16 1520 08/19/16 0315 08/19/16 1202  TROPONINI 0.17* 0.73* 0.50*   BNP Invalid input(s): POCBNP D-Dimer  Recent Labs  08/18/16 1520  DDIMER 0.37   Thyroid Function Tests  Recent Labs  08/19/16 0315  TSH 2.263   _____________  Dg Chest 2 View  Result Date: 08/18/2016 CLINICAL DATA:  Chest and jaw pain for several hours, initial encounter EXAM: CHEST  2 VIEW COMPARISON:  04/08/2016 FINDINGS: The heart size and mediastinal contours are within normal limits. Both lungs are clear. The visualized skeletal structures are unremarkable. IMPRESSION: No active cardiopulmonary disease. Electronically Signed   By: Alcide Clever M.D.   On: 08/18/2016 15:21   Ct Angio Chest Pe W And/or Wo Contrast  Result Date: 08/18/2016 CLINICAL DATA:  Cough chest and jaw pain this morning. Shortness of breath. EXAM: CT ANGIOGRAPHY CHEST WITH CONTRAST TECHNIQUE: Multidetector CT imaging of the chest was performed using the standard protocol during bolus administration of intravenous contrast. Multiplanar CT image reconstructions and MIPs were obtained to evaluate the vascular anatomy. CONTRAST:  100 cc Isovue 370 IV COMPARISON:  Chest x-ray earlier today FINDINGS: Cardiovascular: Heart is normal size.  Aorta is normal caliber. No filling defects in the pulmonary arteries to suggest pulmonary emboli. Mediastinum/Nodes: No mediastinal, hilar, or axillary adenopathy. Lungs/Pleura: Lungs are clear. No focal airspace opacities or suspicious nodules. No effusions. Upper Abdomen: Imaging into the upper abdomen shows no acute findings. Musculoskeletal: Chest wall soft tissues are unremarkable.No acute bony abnormality or focal bone lesion. Review of the MIP images confirms the above findings. IMPRESSION: No evidence of pulmonary embolus.  No acute cardiopulmonary disease. Electronically Signed   By: Charlett Nose M.D.   On: 08/18/2016 16:44   Ct Coronary Morph W/cta Cor W/score W/ca W/cm &/or Wo/cm  Result Date: 08/19/2016 CLINICAL DATA:  25 year old male with chest pain. EXAM: Cardiac/Coronary  CT TECHNIQUE: The patient was scanned on a Philips 256 scanner. FINDINGS: A 120 kV prospective scan was triggered in the descending thoracic aorta at 111 HU's. Axial non-contrast 3 mm slices were carried out through the heart. The data set was analyzed on a dedicated work station and scored using the Agatson method. Gantry rotation speed was 270 msecs and collimation  was .9 mm. 20 mg of iv Metoprolol and 0.4 mg of sl NTG was given. The 3D data set was reconstructed in 5% intervals of the 67-82 % of the R-R cycle. Diastolic phases were analyzed on a dedicated work station using MPR, MIP and VRT modes. The patient received 80 cc of contrast. Aorta:  Normal size.  No calcifications.  No dissection. Aortic Valve:  Trileaflet.  No calcifications. Coronary Arteries:  Normal coronary origin.  Right dominance. RCA is a very large dominant artery that gives rise to PDA and PLVB. There is no plaque. Left main is a large artery that gives rise to LAD, ramus intermedius and LCX arteries. LAD is a large vessel and long vessel that wraps around the apex and gives rise to one diagonal branch. There is no plaque. RI is a large branch that has  no plaque. LCX is a small non-dominant artery that gives rise to one fairly small OM1 branch. There is no plaque. Other findings: Normal pulmonary vein drainage into the left atrium. Normal let atrial appendage without a thrombus. Normal size of the pulmonary artery. No ASD/VSD identified. Normal appearing pericardium, no pericardial effusion. IMPRESSION: 1. Coronary calcium score of 0. This was 0 percentile for age and sex matched control. 2. Normal coronary origin with right dominance. 3. No evidence of CAD. 4. Normal appearing pericardium, no pericardial effusion. 5.  No other incidental intracardiac findings Tobias Alexander Electronically Signed   By: Tobias Alexander   On: 08/19/2016 16:50   Disposition   Pt is being discharged home today in good condition.  Follow-up Plans & Appointments   Follow-up Information    [facility] Follow up.   Specialty:  Cardiology Why:  Our office will your follow-up appointment. Call the office if you have not heard back in 2 business days. Contact information: [address] [place] [address] [phone]          Discharge Instructions    Diet - low sodium heart healthy    Complete by:  As directed    Increase activity slowly    Complete by:  As directed    Your doctor has prescribed the following new medicines:  1) Diltiazem for possible spasm of your heart arteries.  2) Omeprazole to protect your stomach from irritation while taking ibuprofen.  3)  Ibuprofen taper - this is available over the counter in 200mg  tablets. TAKE THIS WITH FOOD. If you notice any stomach upset or signs of gastrointestinal bleeding such as blood in stool or black stools, stop this medicine and call your doctor immediately. Please follow this regimen for the ibuprofen: - Take 4 tablets 3 times a day for 3 days - Then 3 tablets 3 times a day for 2 days - Then 2 tablets 3 times a day for 2 days - Then 1 tablet 3 times a day  for 2 days then stop    No lifting over 5 lbs for 1 week. No sexual activity for 1 week. You may return to work on 08/22/16 if you are feeling well.      Discharge Medications   Allergies as of 08/19/2016   No Known Allergies     Medication List    STOP taking these medications   aspirin-acetaminophen-caffeine 250-250-65 MG tablet Commonly known as:  EXCEDRIN MIGRAINE     TAKE these medications   albuterol 108 (90 Base) MCG/ACT inhaler Commonly known as:  PROVENTIL HFA;VENTOLIN HFA Inhale 1-2 puffs into  the lungs every 6 (six) hours as needed for wheezing or shortness of breath.   amphetamine-dextroamphetamine 20 MG tablet Commonly known as:  ADDERALL Take 1 tablet (20 mg total) by mouth 2 (two) times daily.   buPROPion 300 MG 24 hr tablet Commonly known as:  WELLBUTRIN XL Take 300 mg by mouth daily.   diltiazem 120 MG 24 hr capsule Commonly known as:  CARDIZEM CD Take 1 capsule (120 mg total) by mouth daily. Start taking on:  08/20/2016   FLUoxetine 20 MG tablet Commonly known as:  PROZAC Take 1 tablet (20 mg total) by mouth daily.   ibuprofen 200 MG tablet Commonly known as:  ADVIL,MOTRIN Take 1 tablet (200 mg total) by mouth as directed. - Take 4 tablets 3 times a day for 3 days - Then 3 tablets 3 times a day for 2 days - Then 2 tablets 3 times a day for 2 days - Then 1 tablet 3 times a day for 2 days then stop What changed:  how much to take  when to take this  reasons to take this  additional instructions   montelukast 10 MG tablet Commonly known as:  SINGULAIR Take 1 tablet (10 mg total) by mouth at bedtime.   multivitamin tablet Take 1 tablet by mouth daily.   omeprazole 20 MG capsule Commonly known as:  PRILOSEC Take 1 capsule (20 mg total) by mouth daily. Take while you are taking scheduled ibuprofen.        Allergies:  No Known Allergies    Outstanding Labs/Studies   n/a  Duration of Discharge Encounter   Greater than 30  minutes including physician time.  Signed, Tacey Ruizayna N Dunn PA-C 08/19/2016, 5:42 PM  I saw evaluated the patient prior to his discharge. He looked great. I talked with Dr. Delton SeeNelson about the results of his cardiac CT scan. This is essentially normal.  I'm still not sure what the cause of his chest pain and positive troponin was, but it is clearly not from a coronary artery disease issue unless there is some distal spasm. Usually spasm would have some residual effect on the CT scan that was not seen. There was also no suggestion of inflammation around the pericardium.  However left with any other options, the best course of action be to treat empirically for both as noted.  After about a month of being on diltiazem, we can determine what I would be to simply uses on a when necessary basis.   Bryan Lemmaavid Harding, M.D., M.S. Interventional Cardiologist   Pager # [phone] Phone # [phone] [address] [place], [place] [id number]

## 2016-08-19 NOTE — Progress Notes (Signed)
Patient Name: Nathan Russell Date of Encounter: 08/19/2016  Primary Cardiologist: Gaynelle ArabianNew - Wally Behan, MD  Patient Profile     25 year old gentleman without any prior cardiac history with the exception of an episode of dyspnea and pleuritic chest pain 4 months ago who presented to [facility] yesterday. He was ruled out for PE with a CTA,. His pain lasted close to an hour and finally was alleviated with Toradol. Episode was not provoked. It occurred at rest. Did not notice any change in the symptoms with position or with ambulation.  Troponin level interestingly 1.73    Hospital Problem List     Principal Problem:   Chest pain with high risk for cardiac etiology Active Problems:   Elevated troponin   Subjective   Currently chest pain-free but no active symptoms. Has been walking without any symptoms.  Inpatient Medications    Scheduled Meds: . buPROPion  300 mg Oral Daily  . diltiazem  120 mg Oral Daily  . FLUoxetine  20 mg Oral Daily  . ibuprofen  800 mg Oral TID  . montelukast  10 mg Oral QHS  . multivitamin with minerals  1 tablet Oral Daily  . sodium chloride flush  3 mL Intravenous Q12H   Continuous Infusions: . sodium chloride     PRN Meds:    Vital Signs    Vitals:   08/18/16 2000 08/18/16 2106 08/18/16 2209 08/19/16 0500  BP: 118/87  117/80 (!) 114/40  Pulse: 89  73 62  Resp: 16  18 15   Temp:   98.2 F (36.8 C) 98.5 F (36.9 C)  TempSrc:      SpO2: 96%  96% 95%  Weight:  76.4 kg (168 lb 6.4 oz)  76.1 kg (167 lb 12.8 oz)  Height:  6\' 1"  (1.854 m)      Intake/Output Summary (Last 24 hours) at 08/19/16 1207 Last data filed at 08/19/16 0500  Gross per 24 hour  Intake              360 ml  Output                0 ml  Net              360 ml   Filed Weights   08/18/16 1444 08/18/16 2106 08/19/16 0500  Weight: 79.3 kg (174 lb 12.8 oz) 76.4 kg (168 lb 6.4 oz) 76.1 kg (167 lb 12.8 oz)    Physical Exam    GEN: Well nourished, well  developed, in no acute distress.  HEENT: Grossly normal.  Neck: Supple, no JVD, carotid bruits, or masses. Cardiac: RRR, no murmurs, rubs, With soft S4 gallop (not unusual in a young man). No clubbing, cyanosis, edema.  Radials/DP/PT 2+ and equal bilaterally.  Respiratory:  Respirations regular and unlabored, clear to auscultation bilaterally. GI: Soft, nontender, nondistended, BS + x 4. Skin: warm and dry, no rash. Neuro:  Strength and sensation are intact. Psych: AAOx3.  Normal affect.  Labs    CBC  Recent Labs  08/18/16 1520 08/19/16 0315  WBC 7.5 7.5  NEUTROABS 5.0  --   HGB 16.5 16.8  HCT 46.0 46.1  MCV 90.4 90.2  PLT 217 209   Basic Metabolic Panel  Recent Labs  08/18/16 1520 08/19/16 0315  NA 137 135  K 3.9 3.8  CL 101 103  CO2 27 25  GLUCOSE 82 97  BUN 13 10  CREATININE 1.15 1.22  CALCIUM 9.8 9.5  Liver Function Tests  Recent Labs  08/18/16 1520  AST 21  ALT 20  ALKPHOS 59  BILITOT 0.9  PROT 8.2*  ALBUMIN 5.2*   No results for input(s): LIPASE, AMYLASE in the last 72 hours. Cardiac Enzymes  Recent Labs  08/18/16 1520 08/19/16 0315  TROPONINI 0.17* 0.73*   BNP Invalid input(s): POCBNP D-Dimer  Recent Labs  08/18/16 1520  DDIMER 0.37   Hemoglobin A1C No results for input(s): HGBA1C in the last 72 hours. Fasting Lipid Panel No results for input(s): CHOL, HDL, LDLCALC, TRIG, CHOLHDL, LDLDIRECT in the last 72 hours. Thyroid Function Tests  Recent Labs  08/19/16 0315  TSH 2.263    Telemetry    Sinus rhythm, rates in the 90s to low 100s - Personally Reviewed  ECG    Sinus rhythm 77 BPM. Mild repolarization changes in precordial leads, but otherwise normal EKG - Personally Reviewed  Radiology    Dg Chest 2 View  Result Date: 08/18/2016 CLINICAL DATA:  Chest and jaw pain for several hours, initial encounter EXAM: CHEST  2 VIEW COMPARISON:  04/08/2016 FINDINGS: The heart size and mediastinal contours are within normal  limits. Both lungs are clear. The visualized skeletal structures are unremarkable. IMPRESSION: No active cardiopulmonary disease. Electronically Signed   By: Alcide Clever M.D.   On: 08/18/2016 15:21   Ct Angio Chest Pe W And/or Wo Contrast  Result Date: 08/18/2016 CLINICAL DATA:  Cough chest and jaw pain this morning. Shortness of breath. EXAM: CT ANGIOGRAPHY CHEST WITH CONTRAST TECHNIQUE: Multidetector CT imaging of the chest was performed using the standard protocol during bolus administration of intravenous contrast. Multiplanar CT image reconstructions and MIPs were obtained to evaluate the vascular anatomy. CONTRAST:  100 cc Isovue 370 IV COMPARISON:  Chest x-ray earlier today FINDINGS: Cardiovascular: Heart is normal size. Aorta is normal caliber. No filling defects in the pulmonary arteries to suggest pulmonary emboli. Mediastinum/Nodes: No mediastinal, hilar, or axillary adenopathy. Lungs/Pleura: Lungs are clear. No focal airspace opacities or suspicious nodules. No effusions. Upper Abdomen: Imaging into the upper abdomen shows no acute findings. Musculoskeletal: Chest wall soft tissues are unremarkable.No acute bony abnormality or focal bone lesion. Review of the MIP images confirms the above findings. IMPRESSION: No evidence of pulmonary embolus.  No acute cardiopulmonary disease. Electronically Signed   By: Charlett Nose M.D.   On: 08/18/2016 16:44    Cardiac Studies   Echocardiogram Today: Personally reviewed - I agree with the formal reading of essentially normal echo.  Normal LV size and function. EF 60-65%. No valvular lesions. Normal right ventricular function. Normal PA pressures.   Assessment & Plan      Chest pain with high risk for cardiac etiology/  Elevated troponin   Very interesting situation- chest pain with positive troponins in a young gentleman with no real reason to suspect acute coronary syndrome. Especially with the fact that his symptom was not exacerbated with  activity. I agree that this does not sound like ACS. Highest on the differential diagnosis with a normal echocardiogram is coronary spasm versus pericarditis. I did not see any pericardial effusion on echo and no rub on exam. - He does take her all, therefore as he talks positive for amphetamines.; Coronary spasm potentially related to Adderall versus stress  Plan today is coronary CTA to exclude obstructive CAD. A small lesion may tend to lead to the diagnosis of coronary spasm as opposed to pericarditis.  - Keep him nothing by mouth for now. HLD has  the right sized IV placed. - We'll start low-dose diltiazem - to help with rate control, and also for possible spasm - We'll also treat with a short course of NSAIDs taper: 800 mg 3 times a day 3 days followed by 600 mg 3 times a day for 2 days, 400 mg 3 times a day for 2 days, then 200 mg 3 times a day for 2 days  Pending results the coronary CT, think he may be able to be discharged today since he is not actively having symptoms. Per these able to walk around in the hallway   Signed, Bryan Lemmaavid Emiley Digiacomo, MD  08/19/2016, 12:07 PM

## 2016-08-19 NOTE — Progress Notes (Signed)
CRITICAL VALUE ALERT  Critical value received:  Troponin 0.73  Date of notification:  08/19/2016   Time of notification:  0430  Critical value read back: Yes  Nurse who received alert:  Minna AntisStephanie Barnhill, RN  MD notified (1st page):  Cards Fellow  Time of first page:  [phone]  MD notified (2nd page):  Time of second page:  Responding MD:  Dr. Merrilee JanskyMandawat  Time MD responded:  0440  Pt is CP free, no distress noted. MD advised to not change pt's plan of care and continue after echocardiogram has resulted.

## 2016-09-09 ENCOUNTER — Ambulatory Visit (INDEPENDENT_AMBULATORY_CARE_PROVIDER_SITE_OTHER): Payer: BLUE CROSS/BLUE SHIELD | Admitting: Medical

## 2016-09-09 ENCOUNTER — Telehealth: Payer: Self-pay | Admitting: Medical

## 2016-09-09 ENCOUNTER — Encounter: Payer: Self-pay | Admitting: Medical

## 2016-09-09 VITALS — BP 108/72 | HR 96 | Temp 98.2°F | Resp 16 | Ht 73.0 in | Wt 178.2 lb

## 2016-09-09 DIAGNOSIS — I201 Angina pectoris with documented spasm: Secondary | ICD-10-CM | POA: Diagnosis not present

## 2016-09-09 DIAGNOSIS — R0600 Dyspnea, unspecified: Secondary | ICD-10-CM

## 2016-09-09 NOTE — Telephone Encounter (Signed)
Patient returning call best # [phone]

## 2016-09-09 NOTE — Progress Notes (Signed)
Pre visit review using our clinic review tool, if applicable. No additional management support is needed unless otherwise documented below in the visit note/SLS  

## 2016-09-09 NOTE — Telephone Encounter (Signed)
Called to follow up with patient.  Pt stated he has not gone to the ER yet. States they decided, despite medical advice, to go home and he rest.  Stated if symptoms were to worsen, then go to the ER.  He said he has taken ibuprofen to see if that would help.  Pt was reminded that provider strongly advised that he be seen in the ER.  He stated understanding, but states he hasn't received the flu shot this year and he is apprehensive about going to the ER.  He said he would go if he gets even slight worse.      Message routed to PCP for FYI.

## 2016-09-09 NOTE — Patient Instructions (Addendum)
For your dyspnea, history of elevated troponin recently dx of coronary vasospasm vs pericarditis I do think it is best to have stat work up in the main ED. Labs may include stat troponin, d-dimer, cxr, bnp and maybe beside US. I believe particularly necessary as today is Friday and we are approaching the weekend.  The main ED may have better access to specialist consult compared to ED downstairs  I want you to present this summary to triage at main ED. If you have prolonged wait I am providing my cell number to call. I could talk with charge nurse and explain recent history, current symptoms and the + elevated troponin value.  Follow up with us as determined by ED. Will follow your work up and likely will try to get cardiologist follow up sooner.  Wrote my cell number on avs.

## 2016-09-09 NOTE — Telephone Encounter (Signed)
Called and left a message for call back  

## 2016-09-09 NOTE — Progress Notes (Signed)
Subjective:    Patient ID: Nathan Russell, male    DOB: April 15, 1991, 26 y.o.   MRN: [id number]  HPI  Pt in for follow up.   Pt had some chest pain the other day/on last visit with me when I saw him. Also some chest pain, jaw pain, and dyspnea. Pt was sent to ED downstairs. He had elevated troponin, echo, and ct of chest. Pt was thought to have some spasms of coronary spasm vs pericarditis.   He was admitted and further work up done.  Work up done and on discharge  he was given tapered dose of  Ibuprofen. While he was on medication he was not having any issues. Pt was off medication for a couple of weeks. He has been ibuprofen for 9 days.   Pt sob has been same level for the past 5 days. Has been the same. Same level of sob despite position.  No cocaine use. Drug test was negative in the ED.  D-dimer in ED was not elevated Pt has tried his albuterol and does not improve his dyspnea.   Review of Systems  Constitutional: Negative for chills and fatigue.  HENT: Negative for congestion.   Respiratory: Positive for shortness of breath. Negative for chest tightness and wheezing.   Cardiovascular: Negative for chest pain and palpitations.       None now but brief transient atypical discomfort at times over past 5 days  Gastrointestinal: Negative for abdominal pain.  Musculoskeletal: Negative for back pain.  Skin: Negative for rash.  Neurological: Negative for dizziness, weakness and headaches.  Hematological: Negative for adenopathy. Does not bruise/bleed easily.  Psychiatric/Behavioral: Negative for behavioral problems and confusion. The patient is nervous/anxious.        Recent stress related to work and grandad death. But with hx needs ED evaluation. Address mood later as recent hx makes cardiac and lung evaluation priority.   Past Medical History:  Diagnosis Date  . ADD (attention deficit disorder)   . Allergy   . Hay fever   . Migraines   . Mononucleosis   . Pleuritic chest  pain - possible coronary vasospasm versus pericarditis 08/18/2016   a. 08/2016: troponin 0.7, 2D echo unremarkable, cor CT without evidence of CAD.  Marland Kitchen Seizures (HCC)    once after concussion     Social History   Social History  . Marital status: Single    Spouse name: N/A  . Number of children: N/A  . Years of education: N/A   Occupational History  . 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 Smoking status: Never Smoker  . Smokeless tobacco: Never Used  . Alcohol use 0.0 oz/week     Comment: soical  . Drug use: No  . Sexual activity: Yes    Partners: Female   Other Topics Concern  . Not on file   Social History Narrative  . No narrative on file    Past Surgical History:  Procedure Laterality Date  . TONSILLECTOMY AND ADENOIDECTOMY  2003    Family History  Problem Relation Age of Onset  . Arthritis Mother   . Hyperlipidemia Mother   . Mental illness Mother   . Diabetes Father   . Mental illness Maternal Grandmother   . Hyperlipidemia Maternal Grandfather   . Heart disease Maternal Grandfather   . Hypertension Maternal Grandfather   . Diabetes Maternal Grandfather     No Known Allergies  Current Outpatient Prescriptions on File Prior to Visit  Medication Sig Dispense Refill  . albuterol (PROVENTIL HFA;VENTOLIN HFA) 108 (90 Base) MCG/ACT inhaler Inhale 1-2 puffs into the lungs every 6 (six) hours as needed for wheezing or shortness of breath.    . amphetamine-dextroamphetamine (ADDERALL) 20 MG tablet Take 1 tablet (20 mg total) by mouth 2 (two) times daily. (Patient taking differently: Take 20 mg by mouth 2 (two) times daily as needed. ) 60 tablet 0  . buPROPion (WELLBUTRIN XL) 300 MG 24 hr tablet Take 300 mg by mouth daily.    Marland Kitchen diltiazem (CARDIZEM CD) 120 MG 24 hr capsule Take 1 capsule (120 mg total) by mouth daily. 30 capsule 6  . FLUoxetine (PROZAC) 20 MG tablet Take 1 tablet (20 mg total) by mouth daily. 30 tablet 3  . ibuprofen  (ADVIL,MOTRIN) 200 MG tablet Take 1 tablet (200 mg total) by mouth as directed. - Take 4 tablets 3 times a day for 3 days - Then 3 tablets 3 times a day for 2 days - Then 2 tablets 3 times a day for 2 days - Then 1 tablet 3 times a day for 2 days then stop    . montelukast (SINGULAIR) 10 MG tablet Take 1 tablet (10 mg total) by mouth at bedtime. 30 tablet 3  . Multiple Vitamin (MULTIVITAMIN) tablet Take 1 tablet by mouth daily.     No current facility-administered medications on file prior to visit.     BP 108/72 (BP Location: Right Arm, Patient Position: Sitting, Cuff Size: Large)   Pulse 96   Temp 98.2 F (36.8 C) (Oral)   Resp 16   Ht 6\' 1"  (1.854 m)   Wt 178 lb 4 oz (80.9 kg)   SpO2 98%   BMI 23.52 kg/m       Objective:   Physical Exam  General Mental Status- Alert. General Appearance- Not in acute distress.   Skin General: Color- Normal Color. Moisture- Normal Moisture.  Neck Carotid Arteries- Normal color. Moisture- Normal Moisture. No carotid bruits. No JVD.  Chest and Lung Exam Auscultation: Breath Sounds:-Normal.(Walking short distance in office pulse ox 98. HR increase from mid 80 range to 108.) but no complaint increased sob or any chest pain. Just states always feels sob.  Cardiovascular Auscultation:Rythm- Regular. Murmurs & Other Heart Sounds:Auscultation of the heart reveals- No Murmurs.  Abdomen Inspection:-Inspeection Normal. Palpation/Percussion:Note:No mass. Palpation and Percussion of the abdomen reveal- Non Tender, Non Distended + BS, no rebound or guarding.  Neurologic Cranial Nerve exam:- CN III-XII intact(No nystagmus), symmetric smile. Strength:- 5/5 equal and symmetric strength both upper and lower extremities.  Lower ext- no pedal edema negative homans signs.       Assessment & Plan:  For your dyspnea, history of elevated troponin recently dx of coronary vasospasm vs pericarditis I do think it is best to have stat work up in the main  ED. Labs may include stat troponin, d-dimer, cxr, bnp and maybe beside [place]. I believe particularly necessary as today is Friday and we are approaching the weekend.  The main ED may have better access to specialist consult compared to ED downstairs  I want you to present this summary to triage at main ED. If you have prolonged wait I am providing my cell number to call. I could talk with charge nurse and explain recent history, current symptoms and the + elevated troponin value.  Follow up with [place] as determined by ED. Will follow your work up and likely will try to get cardiologist follow up sooner.  Ekg today shows sinus rhythm. Probable atrial enlargement.  45 minutes spent with pt. 50% of time reviewing records and counseling on condition and need for further work up in ED.   Zaylan Kissoon, Ramon DredgeEdward, PA-C

## 2016-09-09 NOTE — Telephone Encounter (Signed)
Please review my note and avs. Will you call pt and see if they went to ED as I advised?

## 2016-09-11 ENCOUNTER — Telehealth: Payer: Self-pay | Admitting: Medical

## 2016-09-11 DIAGNOSIS — R0789 Other chest pain: Secondary | ICD-10-CM

## 2016-09-11 DIAGNOSIS — R0600 Dyspnea, unspecified: Secondary | ICD-10-CM

## 2016-09-11 NOTE — Telephone Encounter (Signed)
Will you see the cardiologist referral. And see if can get him in earlier.

## 2016-09-12 ENCOUNTER — Telehealth: Payer: Self-pay | Admitting: Medical

## 2016-09-12 NOTE — Telephone Encounter (Signed)
Pt is scheduled to see Dr Tiburcio PeaHarris w/ [facility] on 09/14/16 @10 :30am, pt aware

## 2016-09-12 NOTE — Telephone Encounter (Signed)
Just for clarification. I don't want him to switch cardiologist. Just want his cardiologist to see him sooner based on info that I gave in referral note.

## 2016-09-14 ENCOUNTER — Other Ambulatory Visit: Payer: Self-pay | Admitting: Physician Assistant

## 2017-03-03 IMAGING — DX DG CHEST 2V
2 series · 2 of 2 positions shown · non-contrast
Comparison: 04/08/2016

CLINICAL DATA: Chest and jaw pain for several hours, initial
encounter

EXAM:
CHEST  2 VIEW

[chest pa]
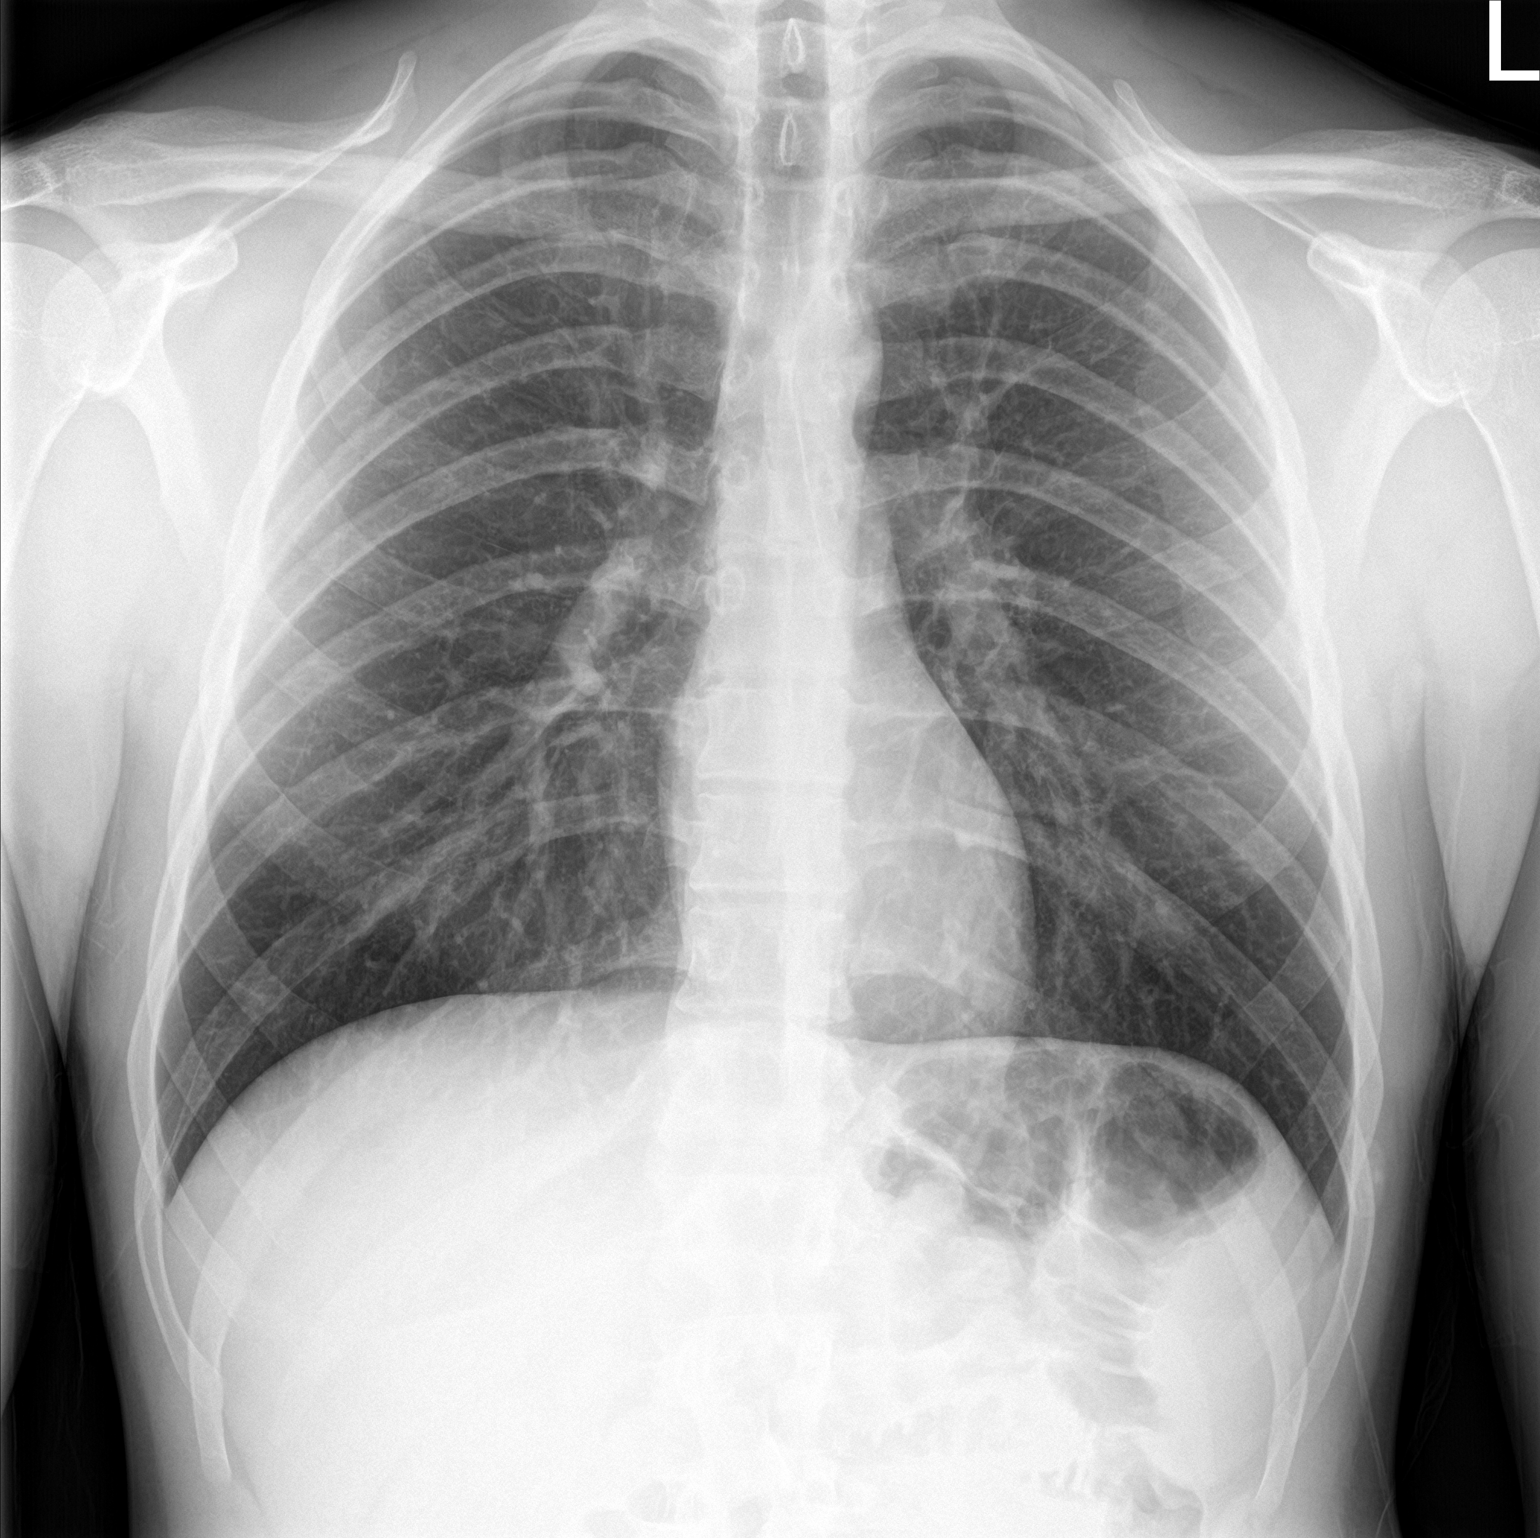

[chest lat]
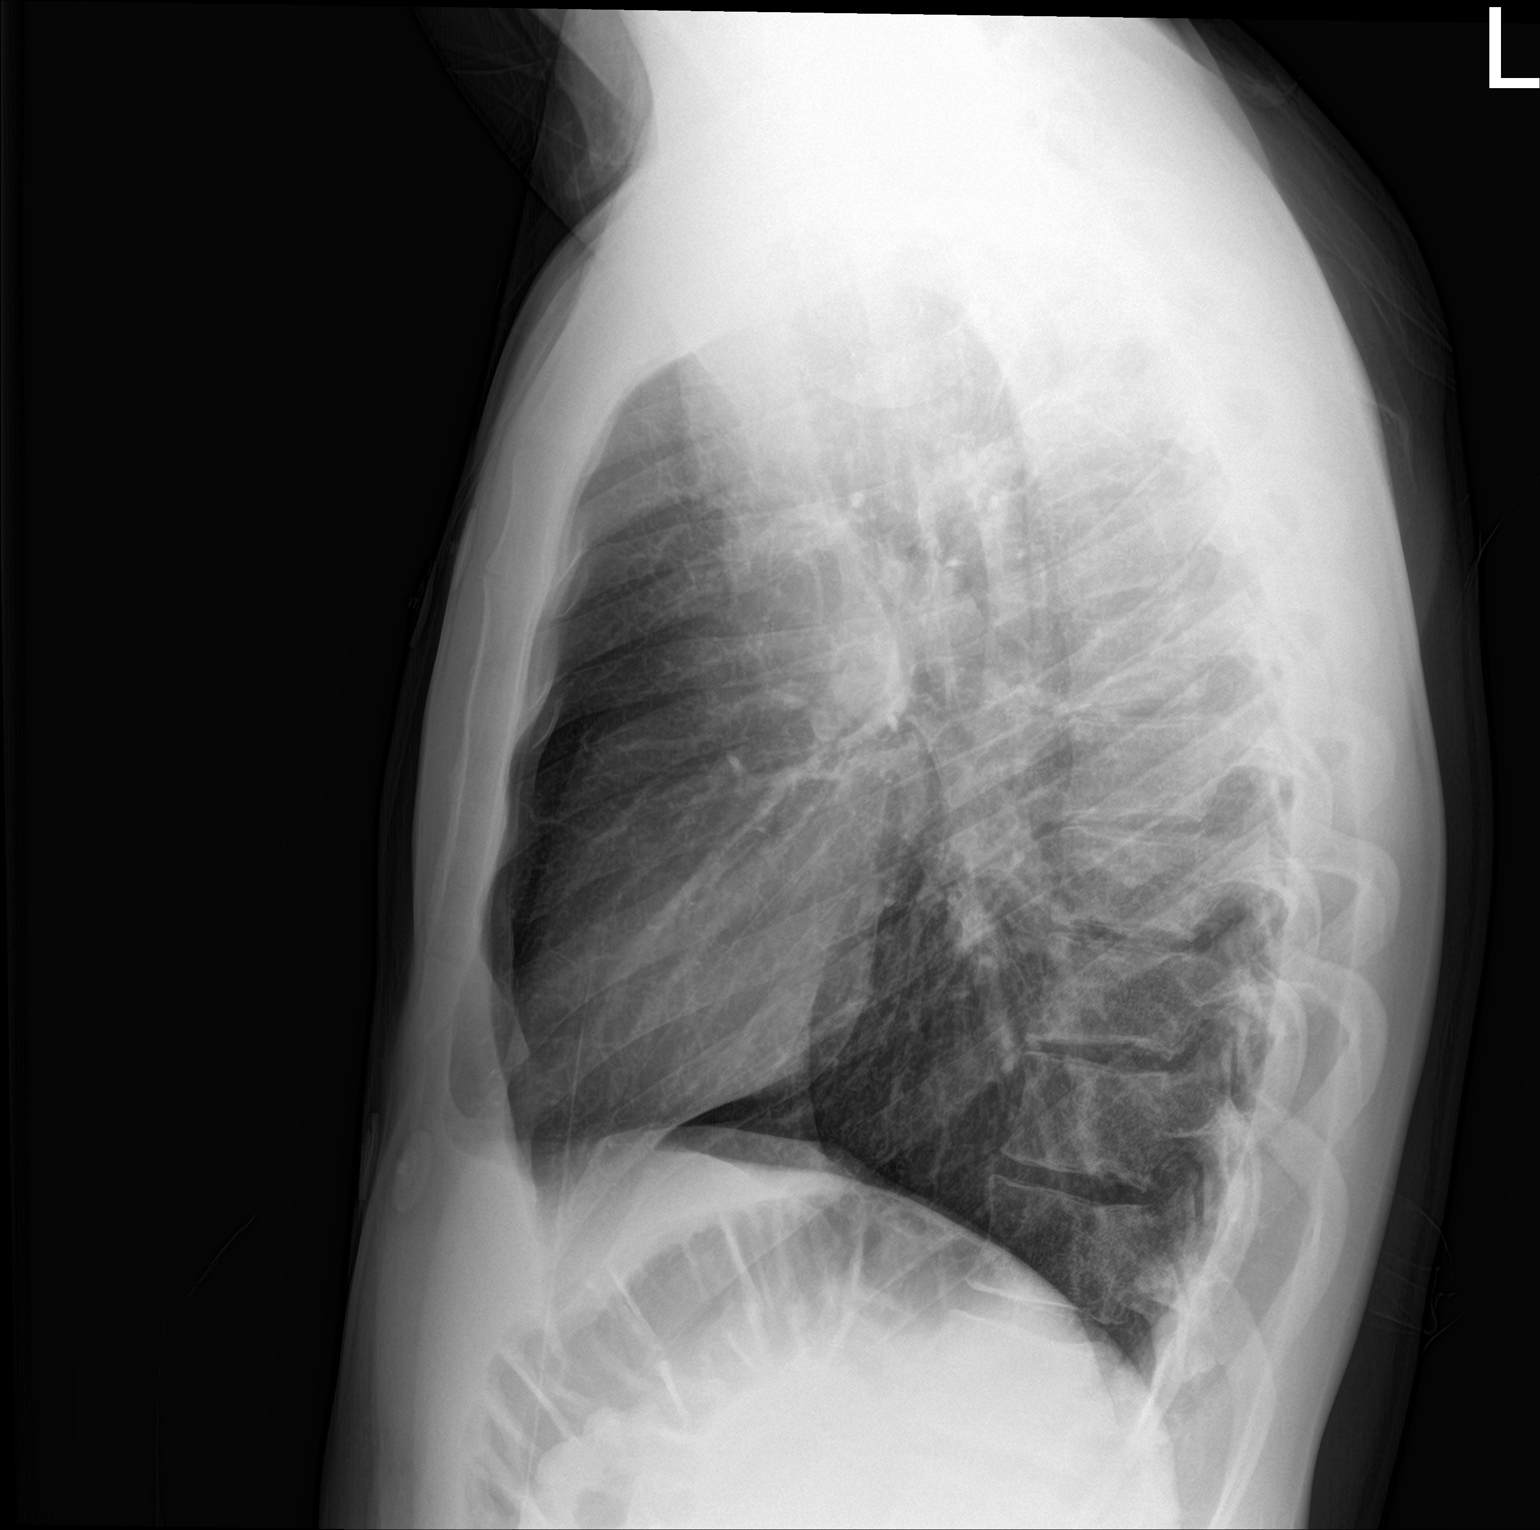

[2 of 2 positions shown; findings below may reference images not displayed]

FINDINGS: The heart size and mediastinal contours are within normal limits.
Both lungs are clear. The visualized skeletal structures are
unremarkable.
IMPRESSION: No active cardiopulmonary disease.

## 2017-03-03 IMAGING — CT CT ANGIO CHEST
2 of 8 series · 19 of 36 positions shown · IV contrast (isovue)
Comparison: Chest x-ray earlier today

CLINICAL DATA: Cough chest and jaw pain this morning. Shortness of
breath.

EXAM:
CT ANGIOGRAPHY CHEST WITH CONTRAST
TECHNIQUE: Multidetector CT imaging of the chest was performed using the
standard protocol during bolus administration of intravenous
contrast. Multiplanar CT image reconstructions and MIPs were
obtained to evaluate the vascular anatomy.
CONTRAST:  100 cc Isovue 370 IV

[Series 6: pe thins · axial · 0.77mm/px · z∈[-347,-75]mm · 18 of 304 slices shown]
[im 16/304  lung]
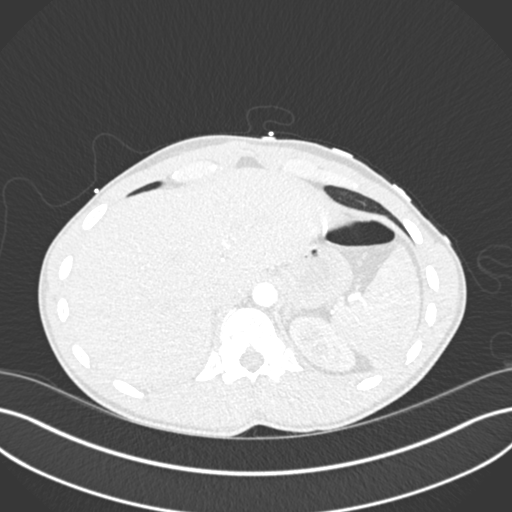
[im 32/304  mediastinal]
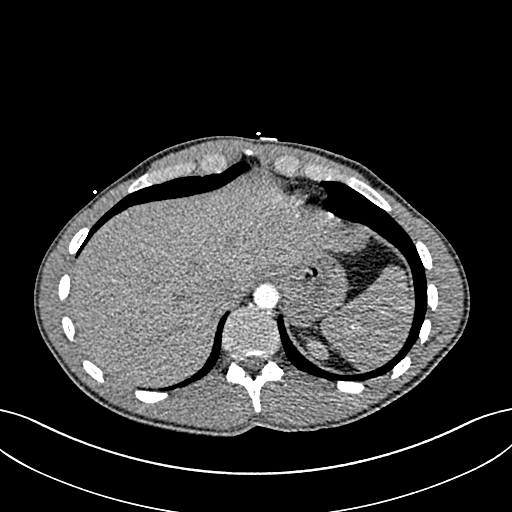
[im 48/304  lung]
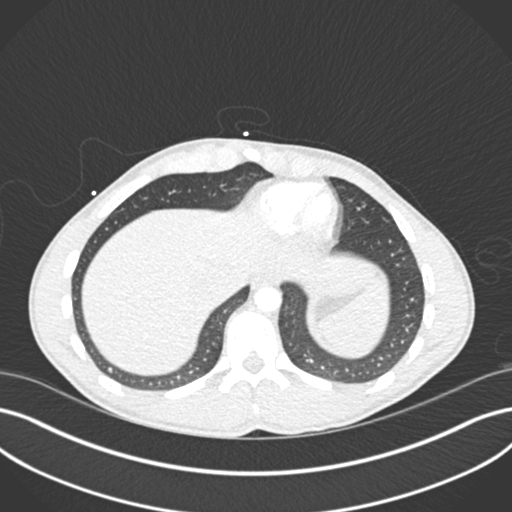
[im 64/304  mediastinal]
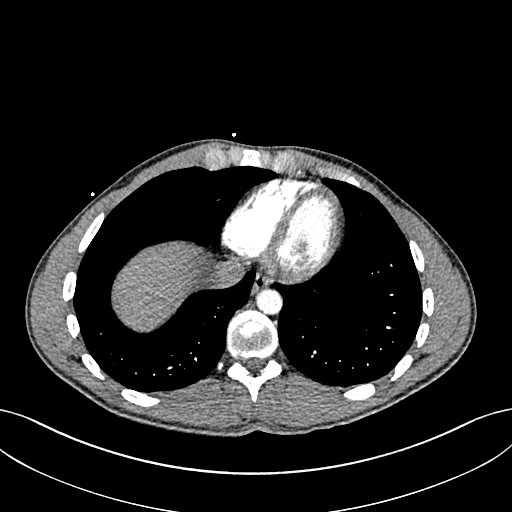
[im 80/304  lung]
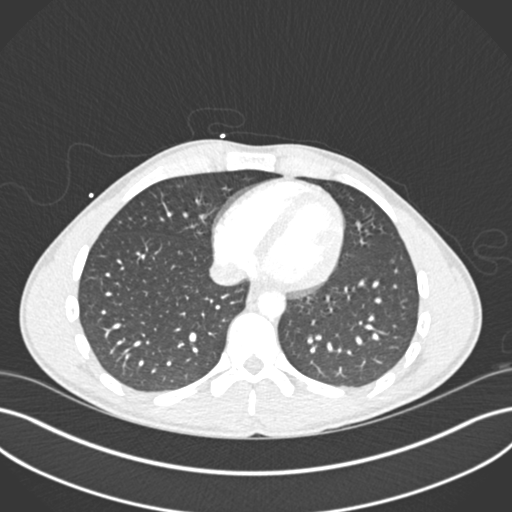
[im 96/304  mediastinal]
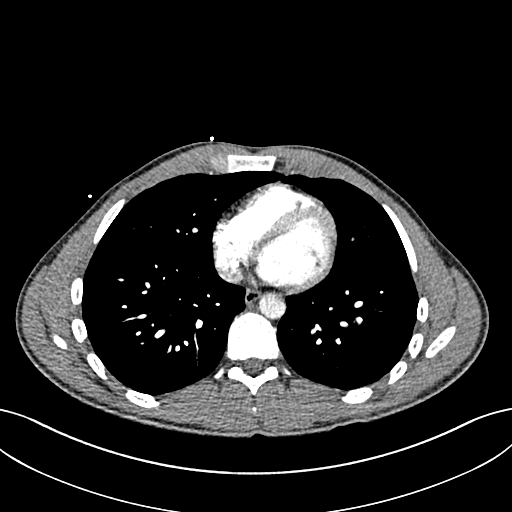
[im 112/304  lung]
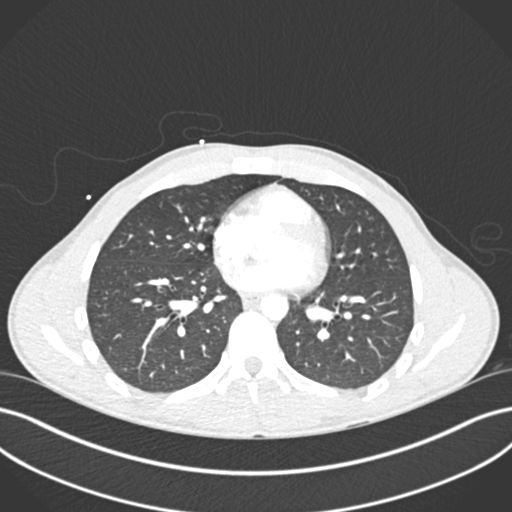
[im 128/304  mediastinal]
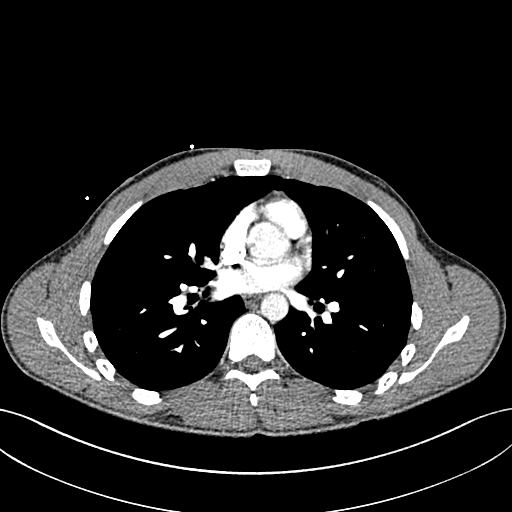
[im 144/304  lung]
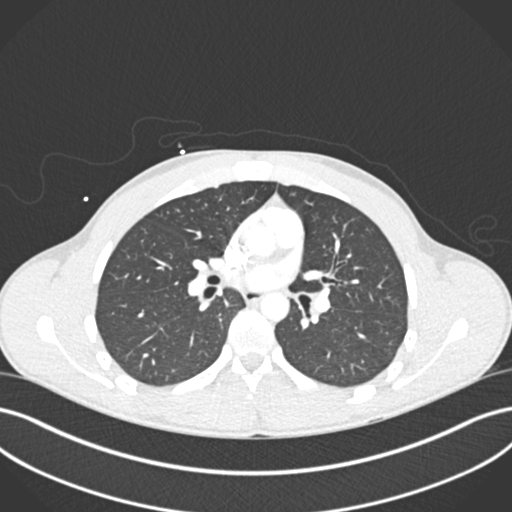
[im 160/304  mediastinal]
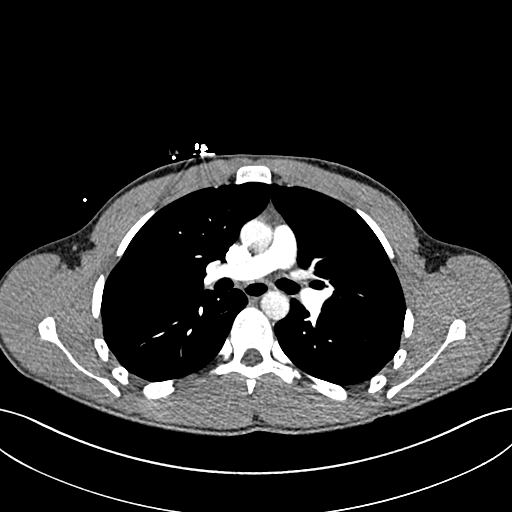
[im 176/304  lung]
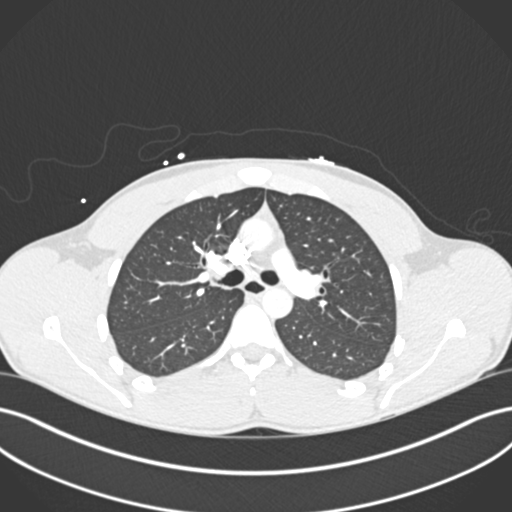
[im 192/304  mediastinal]
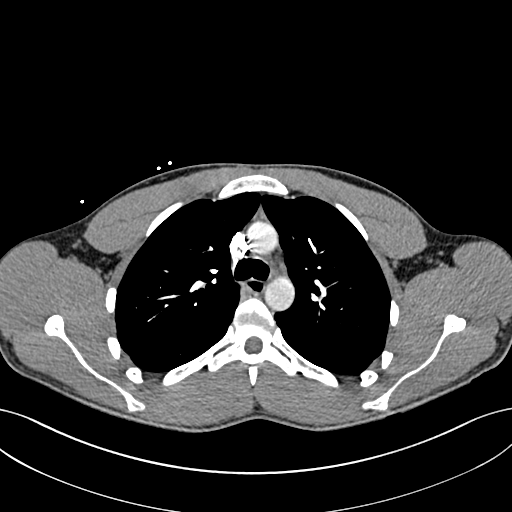
[im 208/304  lung]
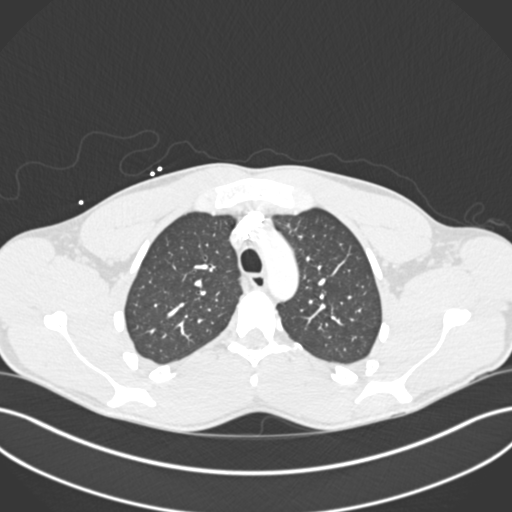
[im 224/304  mediastinal]
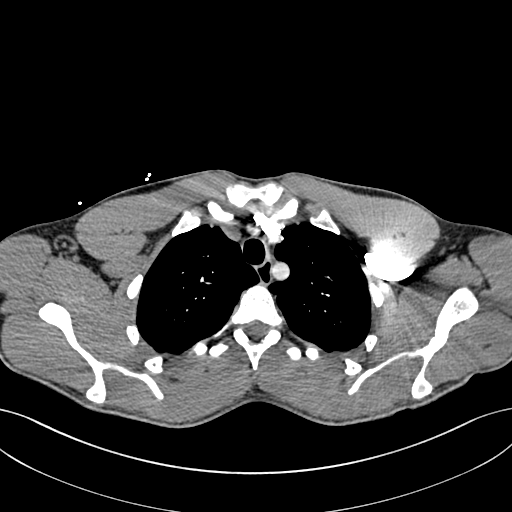
[im 240/304  lung]
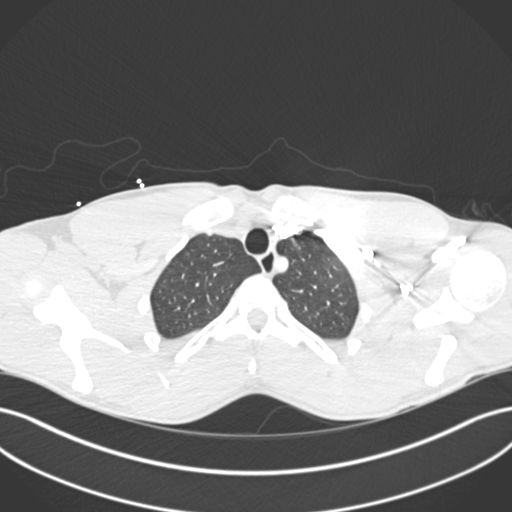
[im 256/304  mediastinal]
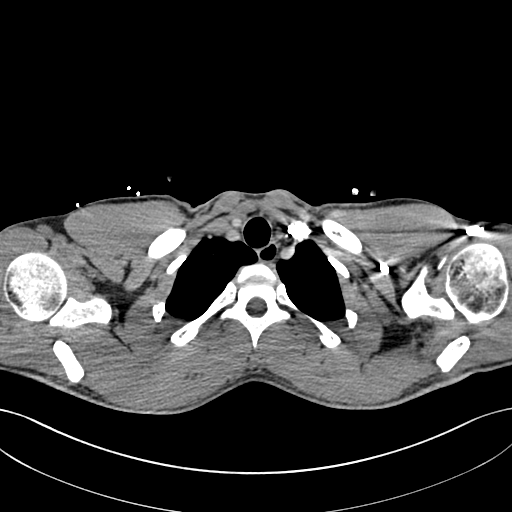
[im 272/304  lung]
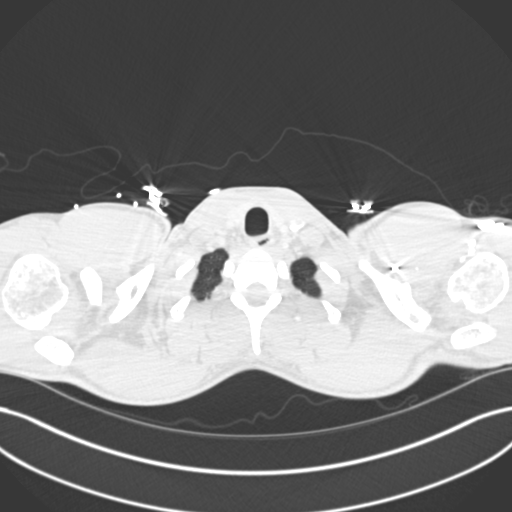
[im 288/304  mediastinal]
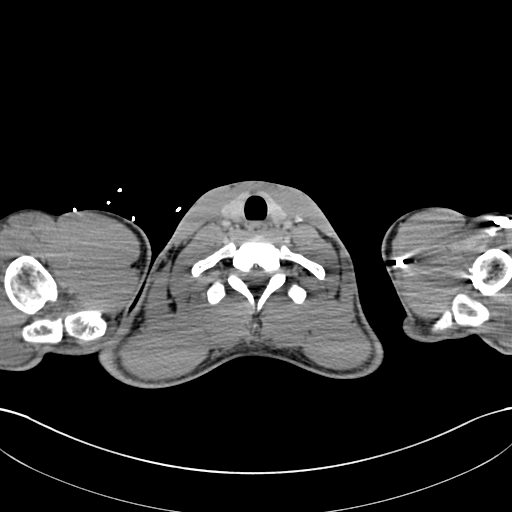

[Series 7: pe coronal mpr · coronal · 0.66mm/px · 1 of 111 slices shown]
[im 56/111  mediastinal]
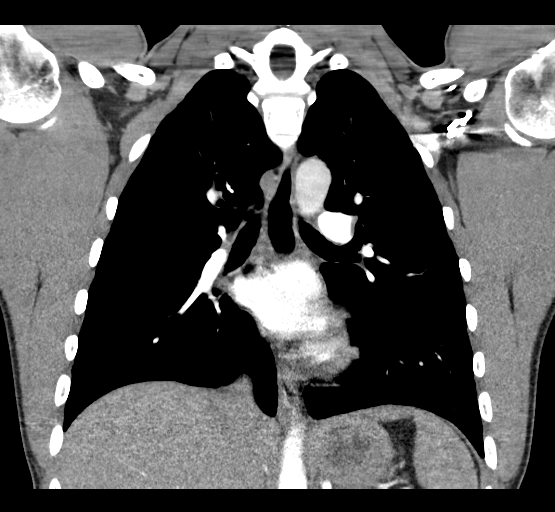

[19 of 36 positions shown; findings below may reference images not displayed]

FINDINGS: Cardiovascular: Heart is normal size. Aorta is normal caliber. No
filling defects in the pulmonary arteries to suggest pulmonary
emboli.

Mediastinum/Nodes: No mediastinal, hilar, or axillary adenopathy.

Lungs/Pleura: Lungs are clear. No focal airspace opacities or
suspicious nodules. No effusions.

Upper Abdomen: Imaging into the upper abdomen shows no acute
findings.

Musculoskeletal: Chest wall soft tissues are unremarkable.No acute
bony abnormality or focal bone lesion.

Review of the MIP images confirms the above findings.
IMPRESSION: No evidence of pulmonary embolus.  No acute cardiopulmonary disease.
